# Patient Record
Sex: Male | Born: 1965 | Race: Black or African American | Hispanic: No | Marital: Single | Smoking: Never smoker
Health system: Southern US, Community
[De-identification: ages and names within clinical notes are randomized; demographics above are authoritative.]

## PROBLEM LIST (undated history)

## (undated) DIAGNOSIS — R519 Headache, unspecified: Secondary | ICD-10-CM

## (undated) DIAGNOSIS — R51 Headache: Secondary | ICD-10-CM

---

## 2014-12-28 ENCOUNTER — Emergency Department (HOSPITAL_COMMUNITY): Payer: 59

## 2014-12-28 ENCOUNTER — Encounter (HOSPITAL_COMMUNITY): Payer: Self-pay | Admitting: *Deleted

## 2014-12-28 ENCOUNTER — Emergency Department (HOSPITAL_COMMUNITY)
Admission: EM | Admit: 2014-12-28 | Discharge: 2014-12-28 | Disposition: A | Discharge status: Home / Self Care | Payer: 59 | Attending: Emergency Medicine | Admitting: Emergency Medicine

## 2014-12-28 DIAGNOSIS — S62613A Displaced fracture of proximal phalanx of left middle finger, initial encounter for closed fracture: Secondary | ICD-10-CM | POA: Diagnosis not present

## 2014-12-28 DIAGNOSIS — S0990XA Unspecified injury of head, initial encounter: Secondary | ICD-10-CM | POA: Insufficient documentation

## 2014-12-28 DIAGNOSIS — Y9389 Activity, other specified: Secondary | ICD-10-CM | POA: Insufficient documentation

## 2014-12-28 DIAGNOSIS — S4992XA Unspecified injury of left shoulder and upper arm, initial encounter: Secondary | ICD-10-CM | POA: Insufficient documentation

## 2014-12-28 DIAGNOSIS — S62629A Displaced fracture of medial phalanx of unspecified finger, initial encounter for closed fracture: Secondary | ICD-10-CM

## 2014-12-28 DIAGNOSIS — Y998 Other external cause status: Secondary | ICD-10-CM | POA: Insufficient documentation

## 2014-12-28 DIAGNOSIS — S59912A Unspecified injury of left forearm, initial encounter: Secondary | ICD-10-CM | POA: Diagnosis not present

## 2014-12-28 DIAGNOSIS — Y92410 Unspecified street and highway as the place of occurrence of the external cause: Secondary | ICD-10-CM | POA: Insufficient documentation

## 2014-12-28 DIAGNOSIS — S6992XA Unspecified injury of left wrist, hand and finger(s), initial encounter: Secondary | ICD-10-CM | POA: Diagnosis present

## 2014-12-28 MED ORDER — HYDROCODONE-ACETAMINOPHEN 5-325 MG PO TABS: 1.0000 | ORAL_TABLET | Freq: Four times a day (QID) | ORAL | Status: DC | PRN
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
## 2014-12-28 NOTE — Discharge Instructions (Signed)
Finger Fracture (Phalangeal) A broken bone of the finger (phalangealfracture) is a common injury for athletes. A single injury (trauma) is likely to fracture multiple bones on the same or different fingers. SYMPTOMS   Severe pain, at the time of injury.  Pain, tenderness, swelling, and later bruising of the finger and then the hand.  Visible deformity, if the fracture is complete and the bone fragments separate enough to distort the normal shape.  Numbness or coldness from swelling in the finger, causing pressure on blood vessels or nerves (uncommon). CAUSES  Direct or indirect injury (trauma) to the finger.  RISK INCREASES WITH:   Contact sports (football, rugby) or other sports where injury to the hand is likely (soccer, baseball, basketball).  Sports that require hitting (boxing, martial arts).  History of bone or joint disease, such as osteoporosis, or previous bone restraint.  Poor hand strength and flexibility. PREVENTION   For contact sports, wear appropriate and properly fitted protective equipment for the hand.  Learn and use proper technique when hitting, punching, or landing after a fall.  If you had a previous finger injury or hand restraint, use tape or padding to protect the finger when playing sports where finger injury is likely. PROGNOSIS  With proper treatment and normal alignment of the bones, healing can usually be expected in 4 to 6 weeks. Sometimes, surgery is needed.  RELATED COMPLICATIONS   Fracture does not heal (nonunion).  Bone heals in wrong position (malunion).  Chronic pain, stiffness, or swelling of the hand.  Excessive bleeding, causing pressure on nerves and blood vessels.  Unstable or arthritic joint, following repeated injury or delayed treatment.  Hindrance of normal growth in children.  Infection in skin broken over the fracture (open fracture) or at the incision or pin sites from surgery.  Shortening of injured bones.  Bony bumps  or loss of shape of the fingers.  Arthritic or stiff finger joint, if the fracture reaches the joint. TREATMENT  If the bones are properly aligned, treatment involves ice and medicine to reduce pain and inflammation. Then, the finger is restrained for 4 or more weeks, to allow for healing. If the fracture is out of alignment (displaced), involves more than one bone, or involves a joint, surgery is usually advised. Surgery often involves placing removable pins, screws, and sometimes plates, to hold the bones in proper alignment. After restraint (with or without surgery), stretching and strengthening exercises are needed. Exercises may be completed at home or with a therapist. For certain sports, wearing a splint or having the finger taped during future activity is advised.  MEDICATION   If pain medicine is needed, nonsteroidal anti-inflammatory medicines (aspirin and ibuprofen), or other minor pain relievers (acetaminophen), are often advised.  Do not take pain medicine for 7 days before surgery.  Prescription pain relievers are usually prescribed only after surgery. Use only as directed and only as much as you need. COLD THERAPY   Cold treatment (icing) relieves pain and reduces inflammation. Cold treatment should be applied for 10 to 15 minutes every 2 to 3 hours, and immediately after activity that aggravates your symptoms. Use ice packs or an ice massage. SEEK MEDICAL CARE IF:   Pain, tenderness, or swelling gets worse, despite treatment.  You experience pain, numbness, or coldness in the hand.  Blue, gray, or dark color appears in the fingernails.  Any of the following occur after surgery: fever, increased pain, swelling, redness, drainage of fluids, or bleeding in the affected area.  New,   unexplained symptoms develop. (Drugs used in treatment may produce side effects.) Document Released: 03/22/2005 Document Revised: 06/14/2011 Document Reviewed: 07/04/2008 ExitCare Patient  Information 2015 [employer]. This information is not intended to replace advice given to you by your health care provider. Make sure you discuss any questions you have with your health care provider.  

## 2015-07-15 ENCOUNTER — Emergency Department (HOSPITAL_COMMUNITY)
Admission: EM | Admit: 2015-07-15 | Discharge: 2015-07-15 | Disposition: A | Discharge status: Home / Self Care | Payer: 59 | Attending: Emergency Medicine | Admitting: Emergency Medicine

## 2015-07-15 ENCOUNTER — Encounter (HOSPITAL_COMMUNITY): Payer: Self-pay | Admitting: *Deleted

## 2015-07-15 DIAGNOSIS — R51 Headache: Secondary | ICD-10-CM | POA: Insufficient documentation

## 2015-07-15 DIAGNOSIS — R112 Nausea with vomiting, unspecified: Secondary | ICD-10-CM | POA: Insufficient documentation

## 2015-07-15 DIAGNOSIS — Z87828 Personal history of other (healed) physical injury and trauma: Secondary | ICD-10-CM | POA: Diagnosis not present

## 2015-07-15 DIAGNOSIS — H53149 Visual discomfort, unspecified: Secondary | ICD-10-CM | POA: Insufficient documentation

## 2015-07-15 DIAGNOSIS — R519 Headache, unspecified: Secondary | ICD-10-CM

## 2015-07-15 HISTORY — DX: Headache, unspecified: R51.9

## 2015-07-15 HISTORY — DX: Headache: R51

## 2015-07-15 MED ORDER — ONDANSETRON 4 MG PO TBDP
4.0000 mg | ORAL_TABLET | Freq: Once | ORAL | Status: AC
  Administered 2015-07-15: 4 mg via ORAL

## 2015-07-15 MED ORDER — PROCHLORPERAZINE EDISYLATE 5 MG/ML IJ SOLN
5.0000 mg | Freq: Once | INTRAMUSCULAR | Status: AC
  Administered 2015-07-15: 5 mg via INTRAVENOUS
  Filled 2015-07-15: qty 2

## 2015-07-15 MED ORDER — KETOROLAC TROMETHAMINE 30 MG/ML IJ SOLN
30.0000 mg | Freq: Once | INTRAMUSCULAR | Status: AC
  Administered 2015-07-15: 30 mg via INTRAVENOUS
  Filled 2015-07-15: qty 1

## 2015-07-15 MED ORDER — DIPHENHYDRAMINE HCL 50 MG/ML IJ SOLN
25.0000 mg | Freq: Once | INTRAMUSCULAR | Status: AC
  Administered 2015-07-15: 25 mg via INTRAVENOUS
  Filled 2015-07-15: qty 1

## 2015-07-15 MED ORDER — ONDANSETRON 4 MG PO TBDP
ORAL_TABLET | ORAL | Status: AC
  Filled 2015-07-15: qty 1

## 2015-07-15 NOTE — Discharge Instructions (Signed)
Please follow-up with the primary care doctor listed for further evaluation and treatment of your headaches and to establish care. Please return to emergency department if you develop any new or concerning symptoms.    Migraine Headache A migraine headache is an intense, throbbing pain on one or both sides of your head. A migraine can last for 30 minutes to several hours. CAUSES  The exact cause of a migraine headache is not always known. However, a migraine may be caused when nerves in the brain become irritated and release chemicals that cause inflammation. This causes pain. Certain things may also trigger migraines, such as:  Alcohol.  Smoking.  Stress.  Menstruation.  Aged cheeses.  Foods or drinks that contain nitrates, glutamate, aspartame, or tyramine.  Lack of sleep.  Chocolate.  Caffeine.  Hunger.  Physical exertion.  Fatigue.  Medicines used to treat chest pain (nitroglycerine), birth control pills, estrogen, and some blood pressure medicines. SIGNS AND SYMPTOMS  Pain on one or both sides of your head.  Pulsating or throbbing pain.  Severe pain that prevents daily activities.  Pain that is aggravated by any physical activity.  Nausea, vomiting, or both.  Dizziness.  Pain with exposure to bright lights, loud noises, or activity.  General sensitivity to bright lights, loud noises, or smells. Before you get a migraine, you may get warning signs that a migraine is coming (aura). An aura may include:  Seeing flashing lights.  Seeing bright spots, halos, or zigzag lines.  Having tunnel vision or blurred vision.  Having feelings of numbness or tingling.  Having trouble talking.  Having muscle weakness. DIAGNOSIS  A migraine headache is often diagnosed based on:  Symptoms.  Physical exam.  A CT scan or MRI of your head. These imaging tests cannot diagnose migraines, but they can help rule out other causes of headaches. TREATMENT Medicines  may be given for pain and nausea. Medicines can also be given to help prevent recurrent migraines.  HOME CARE INSTRUCTIONS  Only take over-the-counter or prescription medicines for pain or discomfort as directed by your health care provider. The use of long-term narcotics is not recommended.  Lie down in a dark, quiet room when you have a migraine.  Keep a journal to find out what may trigger your migraine headaches. For example, write down:  What you eat and drink.  How much sleep you get.  Any change to your diet or medicines.  Limit alcohol consumption.  Quit smoking if you smoke.  Get 7-9 hours of sleep, or as recommended by your health care provider.  Limit stress.  Keep lights dim if bright lights bother you and make your migraines worse. SEEK IMMEDIATE MEDICAL CARE IF:   Your migraine becomes severe.  You have a fever.  You have a stiff neck.  You have vision loss.  You have muscular weakness or loss of muscle control.  You start losing your balance or have trouble walking.  You feel faint or pass out.  You have severe symptoms that are different from your first symptoms. MAKE SURE YOU:   Understand these instructions.  Will watch your condition.  Will get help right away if you are not doing well or get worse.   This information is not intended to replace advice given to you by your health care provider. Make sure you discuss any questions you have with your health care provider.   Document Released: 03/22/2005 Document Revised: 04/12/2014 Document Reviewed: 11/27/2012 Elsevier Interactive Patient Education 2016 Elsevier  Inc. ° °
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
## 2015-07-15 NOTE — ED Notes (Addendum)
Pt c/o HA onset x 3 days ago with hx of frequent headaches without relief OTC meds, pt c/o sensitivity to light with nausea & x 1 vomiting episode, pt A&O x4, follows commands, speaks in complete sentences

## 2016-09-24 ENCOUNTER — Encounter (INDEPENDENT_AMBULATORY_CARE_PROVIDER_SITE_OTHER): Payer: Self-pay | Admitting: Orthopaedic Surgery

## 2016-09-24 ENCOUNTER — Ambulatory Visit (INDEPENDENT_AMBULATORY_CARE_PROVIDER_SITE_OTHER): Payer: 59 | Admitting: Orthopaedic Surgery

## 2016-09-24 DIAGNOSIS — S4491XA Injury of unspecified nerve at shoulder and upper arm level, right arm, initial encounter: Secondary | ICD-10-CM

## 2016-09-24 DIAGNOSIS — S143XXS Injury of brachial plexus, sequela: Secondary | ICD-10-CM | POA: Insufficient documentation

## 2016-09-24 NOTE — Progress Notes (Signed)
   Office Visit Note   Patient: Mark Dalton           Date of Birth: 06/06/1965           MRN: [id number] Visit Date: 09/24/2016              Requested by: No referring provider defined for this encounter. PCP: Patient, No Pcp Per   Assessment & Plan: Visit Diagnoses:  1. 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  F/u prn.  Follow-Up Instructions: Return if symptoms worsen or fail to improve.   Orders:  No orders of the defined types were placed in this encounter.  No orders of the defined types were placed in this encounter.     Procedures: No procedures performed   Clinical Data: No additional findings.   Subjective: Chief Complaint  Patient presents with  . Right arm    needs waiver signed for his CDL    Mark Dalton is a 51 yo male who had a right brachial plexus injury remotely comes in to the office to be evaluated for medical clearance to undergo his [place] CDL license test.  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  His RUE deformity has been stable.     Review of Systems  Constitutional: Negative.   All other systems reviewed and are negative.    Objective: Vital Signs: There were no vitals taken for this visit.  Physical Exam  Constitutional: He is oriented to person, place, and time. He appears well-developed and well-nourished.  HENT:  Head: Normocephalic and atraumatic.  Eyes: Pupils are equal, round, and reactive to light.  Neck: Neck supple.  Pulmonary/Chest: Effort normal.  Abdominal: Soft.  Musculoskeletal: Normal range of motion.  Neurological: He is alert and oriented to person, place, and time.  Skin: Skin is warm.  Psychiatric: He has a normal mood and affect. His behavior is normal. Judgment and thought content  normal.  Nursing note and vitals reviewed.   Ortho Exam RUE exam shows preserved C5 and C6 function.  He lacks C7-T1 function.  Flexible wrist drop. Specialty Comments:  No specialty comments available.  Imaging: No results found.   PMFS History: Patient Active Problem List   Diagnosis Date Noted  . 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 Frequent headaches     No family history on file.  No past surgical history on file. Social History   Occupational History  . Not on file.   Social History Main Topics  . Smoking status: Never Smoker  . Smokeless tobacco: Not on file  . Alcohol use No  . Drug use: No  . Sexual activity: Not on file

## 2016-09-27 ENCOUNTER — Telehealth (INDEPENDENT_AMBULATORY_CARE_PROVIDER_SITE_OTHER): Payer: Self-pay | Admitting: *Deleted

## 2016-09-27 NOTE — Telephone Encounter (Signed)
Pt came in to pick up form that was dropped off Friday 6/22. Asking for a call when this is complete

## 2016-09-28 NOTE — Telephone Encounter (Signed)
I looked at everything and the blank parts are not for me to fill out.

## 2016-09-28 NOTE — Telephone Encounter (Signed)
IC LM advising could be picked up at front desk.

## 2016-09-28 NOTE — Telephone Encounter (Signed)
I gave you this form yesterday.

## 2016-10-04 NOTE — Telephone Encounter (Signed)
Pt came in today to pick up paper that was left, for additional signatures? Pt stated he will come back tomorrow. Looked in drawer and didn't see anything left.

## 2016-10-05 NOTE — Telephone Encounter (Signed)
IC to discuss with patient. No answer. I distinctly remember putting form up front for patient to pick up. It even was logged same day on 06/26.  I have not seen it since then, but I too  Looked in drawer and was unable to locate it.
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
## 2018-03-14 ENCOUNTER — Ambulatory Visit (INDEPENDENT_AMBULATORY_CARE_PROVIDER_SITE_OTHER): Payer: 59 | Admitting: Orthopaedic Surgery

## 2018-03-14 ENCOUNTER — Encounter (INDEPENDENT_AMBULATORY_CARE_PROVIDER_SITE_OTHER): Payer: Self-pay | Admitting: Orthopaedic Surgery

## 2018-03-14 DIAGNOSIS — S4491XD Injury of unspecified nerve at shoulder and upper arm level, right arm, subsequent encounter: Secondary | ICD-10-CM

## 2018-03-14 DIAGNOSIS — S143XXS Injury of brachial plexus, sequela: Secondary | ICD-10-CM

## 2018-03-14 NOTE — Progress Notes (Signed)
   Office Visit Note   Patient: Mark Dalton           Date of Birth: 11/27/1965           MRN: [id number] Visit Date: 03/14/2018              Requested by: No referring provider defined for this encounter. PCP: Patient, No Pcp Per   Assessment & Plan: Visit Diagnoses:  1. 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  He is doing well and stable from orthopedic standpoint.  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  Follow-up as needed. Total face to face encounter time was greater than 25 minutes and over half of this time was spent in counseling and/or coordination of care.  Follow-Up Instructions: Return if symptoms worsen or fail to improve.   Orders:  No orders of the defined types were placed in this encounter.  No orders of the defined types were placed in this encounter.     Procedures: No procedures performed   Clinical Data: No additional findings.   Subjective: Chief Complaint  Patient presents with  . Right Hand - Follow-up    Mr. 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   Review of Systems   Objective: Vital Signs: There were no vitals taken for this visit.  Physical Exam  Ortho Exam Right upper extremity exam is consistent with chronic effects of right brachial plexus injury.  He has a well fitting wrist brace. Specialty Comments:  No specialty comments available.  Imaging: No results found.   PMFS History: Patient Active Problem List   Diagnosis Date Noted  . 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 Frequent headaches     History reviewed. No pertinent family history.  History reviewed. No pertinent surgical history. Social History   Occupational  History  . Not on file  Tobacco Use  . Smoking status: Never Smoker  Substance and Sexual Activity  . Alcohol use: No  . Drug use: No  . Sexual activity: Not on file

## 2018-09-20 ENCOUNTER — Other Ambulatory Visit: Payer: Self-pay

## 2018-09-20 ENCOUNTER — Encounter: Payer: Self-pay | Admitting: Orthopaedic Surgery

## 2018-09-20 ENCOUNTER — Ambulatory Visit (INDEPENDENT_AMBULATORY_CARE_PROVIDER_SITE_OTHER): Payer: 59 | Admitting: Orthopaedic Surgery

## 2018-09-20 VITALS — Ht 70.0 in | Wt 200.0 lb

## 2018-09-20 DIAGNOSIS — S143XXS Injury of brachial plexus, sequela: Secondary | ICD-10-CM | POA: Diagnosis not present

## 2018-09-20 DIAGNOSIS — R29898 Other symptoms and signs involving the musculoskeletal system: Secondary | ICD-10-CM | POA: Diagnosis not present

## 2018-09-20 NOTE — Progress Notes (Signed)
   Office Visit Note   Patient: Mark Dalton           Date of Birth: Apr 05, 1966           MRN: [id number] Visit Date: 09/20/2018              Requested by: No referring provider defined for this encounter. PCP: Patient, No Pcp Per   Assessment & Plan: Visit Diagnoses:  1. 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  He needs to wear his removable wrist splint on the right wrist while driving.  He will follow-up with [place] as needed.  Follow-Up Instructions: Return if symptoms worsen or fail to improve.   Orders:  No orders of the defined types were placed in this encounter.  No orders of the defined types were placed in this encounter.     Procedures: No procedures performed   Clinical Data: No additional findings.   Subjective: Chief Complaint  Patient presents with  . Right Hand - Follow-up    HPI patient is a pleasant 53 year old gentleman who presents our clinic today for correction of paperwork filled out several months ago from a remote brachial plexus injury to the right upper extremity.  He is currently a class a truck driver.  He was driving with a right wrist splint prior to the [place] pandemic without any issues.  During the pandemic, he had to take time off and it was noted that there was a discrepancy in his paperwork.  He comes in today to have this reviewed.  Review of Systems as detailed in HPI.  All others reviewed and are negative.   Objective: Vital Signs: Ht 5\' 10"  (1.778 m)   Wt 200 lb (90.7 kg)   BMI 28.70 kg/m   Physical Exam well-developed and well-nourished gentleman in no acute distress.  Alert and oriented x3.  Ortho Exam stable exam of the right upper extremity  Specialty Comments:  No specialty comments available.  Imaging: No new imaging   PMFS History: Patient Active Problem List   Diagnosis Date Noted  . Late effect of  injury of right brachial plexus 09/24/2016   Past Medical History:  Diagnosis Date  . Frequent headaches     History reviewed. No pertinent family history.  History reviewed. No pertinent surgical history. Social History   Occupational History  . Not on file  Tobacco Use  . Smoking status: Never Smoker  Substance and Sexual Activity  . Alcohol use: No  . Drug use: No  . Sexual activity: Not on file

## 2019-09-27 ENCOUNTER — Other Ambulatory Visit: Payer: Self-pay

## 2019-09-27 ENCOUNTER — Encounter (HOSPITAL_COMMUNITY): Payer: Self-pay

## 2019-09-27 ENCOUNTER — Emergency Department (HOSPITAL_COMMUNITY)
Admission: EM | Admit: 2019-09-27 | Discharge: 2019-09-27 | Disposition: A | Discharge status: Home / Self Care | Payer: 59 | Attending: Emergency Medicine | Admitting: Emergency Medicine

## 2019-09-27 ENCOUNTER — Emergency Department (HOSPITAL_COMMUNITY): Payer: 59

## 2019-09-27 DIAGNOSIS — Y9389 Activity, other specified: Secondary | ICD-10-CM | POA: Insufficient documentation

## 2019-09-27 DIAGNOSIS — Y999 Unspecified external cause status: Secondary | ICD-10-CM | POA: Insufficient documentation

## 2019-09-27 DIAGNOSIS — Z23 Encounter for immunization: Secondary | ICD-10-CM | POA: Diagnosis not present

## 2019-09-27 DIAGNOSIS — S62630A Displaced fracture of distal phalanx of right index finger, initial encounter for closed fracture: Secondary | ICD-10-CM

## 2019-09-27 DIAGNOSIS — W230XXA Caught, crushed, jammed, or pinched between moving objects, initial encounter: Secondary | ICD-10-CM | POA: Insufficient documentation

## 2019-09-27 DIAGNOSIS — Y92810 Car as the place of occurrence of the external cause: Secondary | ICD-10-CM | POA: Diagnosis not present

## 2019-09-27 DIAGNOSIS — S6991XA Unspecified injury of right wrist, hand and finger(s), initial encounter: Secondary | ICD-10-CM | POA: Diagnosis present

## 2019-09-27 DIAGNOSIS — Z79899 Other long term (current) drug therapy: Secondary | ICD-10-CM | POA: Diagnosis not present

## 2019-09-27 MED ORDER — CEPHALEXIN 500 MG PO CAPS: 500.0000 mg | ORAL_CAPSULE | Freq: Four times a day (QID) | ORAL | 0 refills | Status: AC

## 2019-09-27 MED ORDER — TETANUS-DIPHTH-ACELL PERTUSSIS 5-2.5-18.5 LF-MCG/0.5 IM SUSP
0.5000 mL | Freq: Once | INTRAMUSCULAR | Status: AC
Start: 2019-09-27 — End: 2019-09-27
  Administered 2019-09-27: 0.5 mL via INTRAMUSCULAR
  Filled 2019-09-27: qty 0.5

## 2019-09-27 MED ORDER — LIDOCAINE HCL (PF) 1 % IJ SOLN
5.0000 mL | Freq: Once | INTRAMUSCULAR | Status: AC
  Administered 2019-09-27: 5 mL
  Filled 2019-09-27: qty 5

## 2019-09-27 NOTE — ED Triage Notes (Signed)
Patient here with right index finger injury after shutting same in car door this am. Minimal bleeding around nailbed, dressing applied at triage. Minimal pain

## 2019-09-27 NOTE — Discharge Instructions (Addendum)
You have been seen here for a finger fracture.  You have been placed in a brace please keep finger in it and immobilize it for the next few days.  You may apply ice to the area as this will help with inflammation and swelling please keep it elevated.  Do not apply ice to bare skin as this can cause a burn.  You may alternate between taking ibuprofen and Tylenol every 6 hours for pain.  For example you may take ibuprofen wait 6 hours then take Tylenol waiting  6 hours then repeat.  Please follow dosing on the back of the bottle.  Of also prescribed antibiotics please take as prescribed.  Have given you the number for hand surgery please call them to schedule an appointment for reevaluation and management of your broken finger.  I have also given you information for community health and wellness they can provide you with a primary care provider.  I want you to come back to the emergency department if you develop fever, chills, increased swelling or redness around your finger, decreased sensation, or you experience severe pain [place] symptoms require further evaluation and management.

## 2019-09-27 NOTE — ED Provider Notes (Signed)
[facility] EMERGENCY DEPARTMENT Provider Note   CSN: [id number] Arrival date & time: 09/27/19  1761     History No chief complaint on file.   Mark Dalton is a 54 y.o. male.  HPI   Patient presents to the emergency department with chief complaint of right index finger pain that started today.  Patient states while he was getting into his car he slammed the car door against his finger.  Patient states he is unable to move his finger which is baseline for him as he had a stroke and is unable to move the right side of his body.  Patient denies any sort of alleviating or aggravating factors, he has not taken any medication for the pain.  Patient denies hitting his head, losing consciousness, and remembers all events leading up to the accident.  Patient does not have significant medical history, does not take medication on daily basis, denies being any blood thinners.  Patient denies headache, fever, chills, shortness of breath, chest pain, abdominal pain, nausea, vomiting, leg swelling, denies alcohol use, smoking tobacco or marijuana, denies drug use.  Past Medical History:  Diagnosis Date  . Frequent headaches     Patient Active Problem List   Diagnosis Date Noted  . 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 No pertinent surgical history.     No family history on file.  Social History   Tobacco Use  . Smoking status: Never Smoker  Substance Use Topics  . Alcohol use: No  . Drug use: No    Home Medications Prior to Admission medications   Medication Sig Start Date End Date Taking? Authorizing Provider  cephALEXin (KEFLEX) 500 MG capsule Take 1 capsule (500 mg total) by mouth 4 (four) times daily for 7 days. 09/27/19 10/04/19  Carroll Sage, PA-C  HYDROcodone-acetaminophen (NORCO/VICODIN) 5-325 MG per tablet Take 1 tablet by mouth every 6 (six) hours as needed. Patient not taking: Reported on 09/20/2018 12/28/14    Roxy Horseman, PA-C  naproxen sodium (ANAPROX) 220 MG tablet Take 220 mg by mouth daily as needed.     [provider]    Allergies    Patient has no known allergies.  Review of Systems   Review of Systems  Constitutional: Negative for chills and fever.  HENT: Negative for congestion, sneezing and sore throat.   Eyes: Negative for pain.  Respiratory: Negative for cough and shortness of breath.   Cardiovascular: Negative for chest pain and leg swelling.  Gastrointestinal: Negative for abdominal pain, diarrhea, nausea and vomiting.  Genitourinary: Negative for enuresis, flank pain and frequency.  Musculoskeletal: Negative for back pain and joint swelling.       Admits to right index finger pain  Skin: Negative for rash.  Neurological: Negative for dizziness and headaches.  Hematological: Does not bruise/bleed easily.    Physical Exam Updated Vital Signs BP (!) 144/60 (BP Location: Right Arm)   Pulse 65   Temp 97.9 F (36.6 C) (Oral)   Resp 18   Ht 5\' 10"  (1.778 m)   Wt 90.7 kg   SpO2 100%   BMI 28.70 kg/m   Physical Exam Vitals and nursing note reviewed.  Constitutional:      General: He is not in acute distress.    Appearance: Normal appearance. He is not ill-appearing or diaphoretic.  HENT:     Head: Normocephalic and atraumatic.     Nose: No congestion or rhinorrhea.  Eyes:  General: No scleral icterus.       Right eye: No discharge.        Left eye: No discharge.     Conjunctiva/sclera: Conjunctivae normal.     Pupils: Pupils are equal, round, and reactive to light.  Pulmonary:     Effort: Pulmonary effort is normal. No respiratory distress.     Breath sounds: Normal breath sounds. No wheezing.  Musculoskeletal:     Cervical back: Neck supple.     Right lower leg: No edema.     Left lower leg: No edema.     Comments: Patient's right index finger was visualized, there was erythema around the distal end of his finger, the proximal end of his  fingernail was partly lifted out of the skin bleeding was controlled.  There was no rash, laceration, or other gross abnormalities noted.  Patient had good capillary refill, finger was soft to the touch, sensation was intact unable to evaluate motor function as hand is paralyzed at baseline.  Skin:    General: Skin is warm and dry.     Capillary Refill: Capillary refill takes less than 2 seconds.     Coloration: Skin is not jaundiced or pale.  Neurological:     Mental Status: He is alert and oriented to person, place, and time.  Psychiatric:        Mood and Affect: Mood normal.     ED Results / Procedures / Treatments   Labs (all labs ordered are listed, but only abnormal results are displayed) Labs Reviewed - No data to display  EKG None  Radiology DG Finger Index Right  Result Date: 09/27/2019 CLINICAL DATA:  Slammed in a door EXAM: RIGHT INDEX FINGER 2+V COMPARISON:  None. FINDINGS: There is a comminuted mildly displaced fracture seen through the distal second phalanx. No definite intra-articular extension. Overlying soft tissue swelling is seen. The nail bed appears to be intact. IMPRESSION: Comminuted mildly displaced distal phalanx fracture. No definite intra-articular extension. Electronically Signed   By: Jonna Clark M.D.   On: 09/27/2019 08:13    Procedures Wound repair  Date/Time: 09/27/2019 4:53 PM Performed by: Carroll Sage, PA-C Authorized by: Carroll Sage, PA-C  Consent: Verbal consent obtained. Risks and benefits: risks, benefits and alternatives were discussed Consent given by: patient Patient understanding: patient states understanding of the procedure being performed Patient consent: the patient's understanding of the procedure matches consent given Procedure consent: procedure consent matches procedure scheduled Relevant documents: relevant documents present and verified Test results: test results available and properly labeled Site marked: the  operative site was marked Imaging studies: imaging studies available Patient identity confirmed: verbally with patient Preparation: Patient was prepped and draped in the usual sterile fashion. Local anesthesia used: yes Anesthesia: digital block  Anesthesia: Local anesthesia used: yes Local Anesthetic: lidocaine 1% without epinephrine  Sedation: Patient sedated: no  Patient tolerance: patient tolerated the procedure well with no immediate complications Comments: Nerve block was used, patient's finger was placed in saline solution and the area was cleaned thoroughly.  Fingernail was manipulated and try to place back under skin.  Unable to fully have nailbed underneath skin.  Area was washed and placed in a sterile dressing.  Sensation and vascular was reassessed and was normal, good cap refill.     (including critical care time)  Medications Ordered in ED Medications  Tdap (BOOSTRIX) injection 0.5 mL (0.5 mLs Intramuscular Given 09/27/19 1308)  lidocaine (PF) (XYLOCAINE) 1 % injection 5 mL (5  mLs Infiltration Given by Other 09/27/19 1300)    ED Course  I have reviewed the triage vital signs and the nursing notes.  Pertinent labs & imaging results that were available during my care of the patient were reviewed by me and considered in my medical decision making (see chart for details).    MDM Rules/Calculators/A&P                          I have personally reviewed all imaging, labs and have interpreted them.  X-ray of right index finger was obtained and showed commuted mildly displaced distal pharynx fracture.  Due to nailbed involvement possible open fracture and will place on antibiotics for treatment.  Unable to place fingernail underneath skin, distal part of nail protruded above the skin.  Will wash skin thoroughly, wrapped in dressing.  Finger will be placed in a splint for finger immobilization.  Patient was given tetanus booster.  Unlikely patient suffering from overlying  cellulitis as there was no discharge, drainage, wounds or duration noted on exam.  Unlikely patient suffering from a compartment syndrome as finger was soft to the touch, good capillary refill, sensation was intact.  Risk and benefits of removing nail was discussed with attending and due to increased risk of infection out ways benefits of removing the nail bed. will defer nail removal and  place finger in splint and wrapped in bandage.   Patient appears to be resting comfortably, patient does not appear to be in any acute distress.  Vitals have remained stable and patient does not meet criteria to be admitted to the hospital.  Likely patient suffered a fracture from a closing door on finger.  Will be placed in a splint as well as placed on antibiotics for infection coverage.  Patient will be referred to hand for further evaluation.  Patient was discussed with attending who agrees with assessment and plan.  Patient was given at home instructions as well as strict return precautions.  Patient was explained the results and plan, patient states he understands agrees to plan. Final Clinical Impression(s) / ED Diagnoses Final diagnoses:  Displaced fracture of distal phalanx of right index finger, initial encounter for closed fracture    Rx / DC Orders ED Discharge Orders         Ordered    cephALEXin (KEFLEX) 500 MG capsule  4 times daily     Discontinue  Reprint     09/27/19 1420           Aron Baba 09/27/19 1703    Tegeler, Gwenyth Allegra, MD 09/27/19 2148

## 2019-10-17 ENCOUNTER — Encounter (INDEPENDENT_AMBULATORY_CARE_PROVIDER_SITE_OTHER): Payer: Self-pay | Admitting: Primary Care

## 2019-10-17 ENCOUNTER — Other Ambulatory Visit: Payer: Self-pay

## 2019-10-17 ENCOUNTER — Other Ambulatory Visit (INDEPENDENT_AMBULATORY_CARE_PROVIDER_SITE_OTHER): Payer: 59

## 2019-10-17 ENCOUNTER — Ambulatory Visit (INDEPENDENT_AMBULATORY_CARE_PROVIDER_SITE_OTHER): Payer: 59 | Admitting: Primary Care

## 2019-10-17 VITALS — BP 136/82 | HR 89 | Temp 98.5°F | Ht 70.0 in | Wt 204.8 lb

## 2019-10-17 DIAGNOSIS — S143XXS Injury of brachial plexus, sequela: Secondary | ICD-10-CM

## 2019-10-17 DIAGNOSIS — Z125 Encounter for screening for malignant neoplasm of prostate: Secondary | ICD-10-CM

## 2019-10-17 DIAGNOSIS — Z6829 Body mass index (BMI) 29.0-29.9, adult: Secondary | ICD-10-CM | POA: Diagnosis not present

## 2019-10-17 DIAGNOSIS — Z1211 Encounter for screening for malignant neoplasm of colon: Secondary | ICD-10-CM

## 2019-10-17 DIAGNOSIS — Z7689 Persons encountering health services in other specified circumstances: Secondary | ICD-10-CM | POA: Diagnosis not present

## 2019-10-17 DIAGNOSIS — R030 Elevated blood-pressure reading, without diagnosis of hypertension: Secondary | ICD-10-CM

## 2019-10-17 NOTE — Progress Notes (Signed)
New Patient Office Visit  Subjective:  Patient ID: Mark Dalton, male    DOB: 08-15-1965  Age: 54 y.o. MRN: [id number]  CC:  Chief Complaint  Patient presents with   New Patient (Initial Visit)    pt would like referral to a specific hand specialist     HPI Mr. Mark Dalton presents for establish of care. He broke 4 th finger tip in the car door. Seen in ED they referred him to the Hand specialist. He went but because he was not referred by a PCP all these expenses are out of pocket.  Past Medical History:  Diagnosis Date   Frequent headaches     History reviewed. No pertinent surgical history.  History reviewed. No pertinent family history.  Social History   Socioeconomic History   Marital status: Single    Spouse name: Not on file   Number of children: Not on file   Years of education: Not on file   Highest education level: Not on file  Occupational History   Not on file  Tobacco Use   Smoking status: Never Smoker   Smokeless tobacco: Never Used  Substance and Sexual Activity   Alcohol use: No   Drug use: No   Sexual activity: Not Currently  Other Topics Concern   Not on file  Social History Narrative   Not on file   Social Determinants of Health   Financial Resource Strain:    Difficulty of Paying Living Expenses:   Food Insecurity:    Worried About Programme researcher, broadcasting/film/video in the Last Year:    Barista in the Last Year:   Transportation Needs:    Freight forwarder (Medical):    Lack of Transportation (Non-Medical):   Physical Activity:    Days of Exercise per Week:    Minutes of Exercise per Session:   Stress:    Feeling of Stress :   Social Connections:    Frequency of Communication with Friends and Family:    Frequency of Social Gatherings with Friends and Family:    Attends Religious Services:    Active Member of Clubs or Organizations:    Attends Engineer, structural:    Marital Status:   Intimate  Partner Violence:    Fear of Current or Ex-Partner:    Emotionally Abused:    Physically Abused:    Sexually Abused:     ROS Review of Systems  Musculoskeletal:       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    Objective:   Today's Vitals: BP 136/82 (BP Location: Right Arm, Patient Position: Sitting, Cuff Size: Normal)    Pulse 89    Temp 98.5 F (36.9 C) (Oral)    Ht 5\' 10"  (1.778 m)    Wt 204 lb 12.8 oz (92.9 kg)    SpO2 98%    BMI 29.39 kg/m   Physical Exam General: Vital signs reviewed.  Patient is obese  well-developed and well-nourished male , in no acute distress and cooperative with exam.  Head: Normocephalic and atraumatic. Eyes: EOMI, conjunctivae normal, no scleral icterus.  Neck: Supple, trachea midline, normal ROM, no JVD, masses, thyromegaly, or carotid bruit present.  Cardiovascular: RRR, S1 normal, S2 normal, no murmurs, gallops, or rubs. Pulmonary/Chest: Clear to auscultation bilaterally, no wheezes, rales, or rhonchi. Abdominal: Soft, non-tender, non-distended, BS +, no masses, organomegaly, or guarding present.  Musculoskeletal: No joint deformities, erythema, or stiffness,  ROM full and nontender. Extremities: No lower extremity edema bilaterally,  pulses symmetric and intact bilaterally. No cyanosis or clubbing. Neurological: A&O x3, Strength is normal and symmetric bilaterally, cranial nerve II-XII are grossly intact, no focal motor deficit, sensory intact to light touch bilaterally.  Skin: Warm, dry and intact. No rashes or erythema. Psychiatric: Normal mood and affect. speech and behavior is normal. Cognition and memory are normal.  Assessment & Plan:  Keller was seen today for new patient (initial visit).  Diagnoses and all orders for this visit:  Encounter to establish care .Gwinda Passe, NP-C will be your  (PCP) she is mastered prepared . Able to diagnosed and treatment also  answer health concern  as well as continuing care of varied medical conditions, not limited by cause, organ system, or diagnosis.   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 DIET: Limit salt intake, read nutrition labels to check salt content, limit fried and high fatty foods , can foods- enjoys spam , potted meat  Avoid using multisymptom OTC cold preparations that generally contain sudafed which can rise BP. Consult with pharmacist on best cold relief products to use for persons with HTN EXERCISE Discussed incorporating exercise such as walking - 30 minutes most days of the week and can do in 10 minute intervals   -     Comprehensive metabolic panel; Future   Body mass index (BMI) 29.0-29.9, adult Discussed diet and exercise for person with BMI >25. Instructed: You must burn more calories than you eat. Losing 5 percent of your body weight should be considered a success. In the longer term, losing more than 15 percent of your body weight and staying at this weight is an extremely good result. However, keep in mind that even losing 5 percent of your body weight leads to important health benefits, so try not to get discouraged if you're not able to lose more than this. Will recheck weight in 3-6 months. -     Lipid panel; Future -     PSA; Future  Colon cancer screening Normal colon cancer screening.  CDC recommends colorectal screening from ages 76-75.  -     Ambulatory referral to Gastroenterology  Prostate cancer screening Prostrate Cancer Screening For men aged 52 to 109 years, the decision to undergo periodic prostate-specific antigen (PSA)-based screening for prostate cancer should be an individual one.  -     PSA;  Future    Outpatient Encounter Medications as of 10/17/2019  Medication Sig   [DISCONTINUED] HYDROcodone-acetaminophen (NORCO/VICODIN) 5-325 MG per tablet Take 1 tablet by mouth every 6 (six) hours as needed. (Patient not taking: Reported on 09/20/2018)   [DISCONTINUED] naproxen sodium (ANAPROX) 220 MG tablet Take 220 mg by mouth daily as needed.    No facility-administered encounter medications on file as of 10/17/2019.    Follow-up: Return in about 3 months (around 01/17/2020) for in person Bp.   Grayce Sessions, NP

## 2019-10-17 NOTE — Patient Instructions (Signed)

## 2019-10-18 ENCOUNTER — Other Ambulatory Visit (INDEPENDENT_AMBULATORY_CARE_PROVIDER_SITE_OTHER): Payer: 59

## 2019-10-18 DIAGNOSIS — Z6829 Body mass index (BMI) 29.0-29.9, adult: Secondary | ICD-10-CM

## 2019-10-18 DIAGNOSIS — Z7689 Persons encountering health services in other specified circumstances: Secondary | ICD-10-CM

## 2019-10-18 DIAGNOSIS — R030 Elevated blood-pressure reading, without diagnosis of hypertension: Secondary | ICD-10-CM

## 2019-10-18 DIAGNOSIS — Z125 Encounter for screening for malignant neoplasm of prostate: Secondary | ICD-10-CM

## 2019-10-19 LAB — COMPREHENSIVE METABOLIC PANEL
ALT: 27 IU/L (ref 0–44)
AST: 26 IU/L (ref 0–40)
Albumin/Globulin Ratio: 1.5 (ref 1.2–2.2)
Albumin: 4.4 g/dL (ref 3.8–4.9)
Alkaline Phosphatase: 81 IU/L (ref 48–121)
BUN/Creatinine Ratio: 10 (ref 9–20)
BUN: 11 mg/dL (ref 6–24)
Bilirubin Total: 0.3 mg/dL (ref 0.0–1.2)
CO2: 24 mmol/L (ref 20–29)
Calcium: 9.2 mg/dL (ref 8.7–10.2)
Chloride: 104 mmol/L (ref 96–106)
Creatinine, Ser: 1.05 mg/dL (ref 0.76–1.27)
GFR calc Af Amer: 91 mL/min/{1.73_m2} (ref >59)
GFR calc non Af Amer: 79 mL/min/{1.73_m2} (ref >59)
Globulin, Total: 3 g/dL (ref 1.5–4.5)
Glucose: 95 mg/dL (ref 65–99)
Potassium: 4.3 mmol/L (ref 3.5–5.2)
Sodium: 140 mmol/L (ref 134–144)
Total Protein: 7.4 g/dL (ref 6.0–8.5)

## 2019-10-19 LAB — CBC WITH DIFFERENTIAL/PLATELET
Basophils Absolute: 0.1 10*3/uL (ref 0.0–0.2)
Basos: 1 %
EOS (ABSOLUTE): 0.2 10*3/uL (ref 0.0–0.4)
Eos: 4 %
Hematocrit: 28.9 % — ABNORMAL LOW (ref 37.5–51.0)
Hemoglobin: 7.9 g/dL — ABNORMAL LOW (ref 13.0–17.7)
Immature Grans (Abs): 0 10*3/uL (ref 0.0–0.1)
Immature Granulocytes: 0 %
Lymphocytes Absolute: 1.5 10*3/uL (ref 0.7–3.1)
Lymphs: 27 %
MCH: 18.8 pg — ABNORMAL LOW (ref 26.6–33.0)
MCHC: 27.3 g/dL — ABNORMAL LOW (ref 31.5–35.7)
MCV: 69 fL — ABNORMAL LOW (ref 79–97)
Monocytes Absolute: 0.4 10*3/uL (ref 0.1–0.9)
Monocytes: 7 %
Neutrophils Absolute: 3.3 10*3/uL (ref 1.4–7.0)
Neutrophils: 61 %
Platelets: 378 10*3/uL (ref 150–450)
RBC: 4.2 x10E6/uL (ref 4.14–5.80)
RDW: 16.4 % — ABNORMAL HIGH (ref 11.6–15.4)
WBC: 5.4 10*3/uL (ref 3.4–10.8)

## 2019-10-19 LAB — LIPID PANEL
Chol/HDL Ratio: 5.1 ratio — ABNORMAL HIGH (ref 0.0–5.0)
Cholesterol, Total: 169 mg/dL (ref 100–199)
HDL: 33 mg/dL — ABNORMAL LOW (ref >39)
LDL Chol Calc (NIH): 111 mg/dL — ABNORMAL HIGH (ref 0–99)
Triglycerides: 136 mg/dL (ref 0–149)
VLDL Cholesterol Cal: 25 mg/dL (ref 5–40)

## 2019-10-19 LAB — PSA: Prostate Specific Ag, Serum: 0.4 ng/mL (ref 0.0–4.0)

## 2019-10-30 ENCOUNTER — Other Ambulatory Visit (INDEPENDENT_AMBULATORY_CARE_PROVIDER_SITE_OTHER): Payer: Self-pay | Admitting: Primary Care

## 2019-10-30 MED ORDER — IRON (FERROUS SULFATE) 325 (65 FE) MG PO TABS: 325.0000 mg | ORAL_TABLET | Freq: Two times a day (BID) | ORAL | 1 refills | Status: DC

## 2019-11-06 ENCOUNTER — Telehealth (INDEPENDENT_AMBULATORY_CARE_PROVIDER_SITE_OTHER): Payer: Self-pay

## 2019-11-06 NOTE — Telephone Encounter (Signed)
Patient verified date of birth. He is aware of normal labs and normal PSA. Maryjean Morn, CMA

## 2020-01-17 ENCOUNTER — Ambulatory Visit (INDEPENDENT_AMBULATORY_CARE_PROVIDER_SITE_OTHER): Payer: 59 | Admitting: Primary Care

## 2022-02-02 ENCOUNTER — Encounter: Payer: Self-pay | Admitting: Emergency Medicine

## 2022-02-02 ENCOUNTER — Ambulatory Visit (INDEPENDENT_AMBULATORY_CARE_PROVIDER_SITE_OTHER): Payer: 59 | Admitting: Emergency Medicine

## 2022-02-02 VITALS — BP 136/82 | HR 78 | Temp 98.1°F | Ht 70.0 in | Wt 199.2 lb

## 2022-02-02 DIAGNOSIS — Z1159 Encounter for screening for other viral diseases: Secondary | ICD-10-CM

## 2022-02-02 DIAGNOSIS — Z13228 Encounter for screening for other metabolic disorders: Secondary | ICD-10-CM | POA: Diagnosis not present

## 2022-02-02 DIAGNOSIS — Z7689 Persons encountering health services in other specified circumstances: Secondary | ICD-10-CM

## 2022-02-02 DIAGNOSIS — N529 Male erectile dysfunction, unspecified: Secondary | ICD-10-CM

## 2022-02-02 DIAGNOSIS — Z130 Encounter for screening for diseases of the blood and blood-forming organs and certain disorders involving the immune mechanism: Secondary | ICD-10-CM

## 2022-02-02 DIAGNOSIS — Z0000 Encounter for general adult medical examination without abnormal findings: Secondary | ICD-10-CM | POA: Diagnosis not present

## 2022-02-02 DIAGNOSIS — Z125 Encounter for screening for malignant neoplasm of prostate: Secondary | ICD-10-CM

## 2022-02-02 DIAGNOSIS — Z114 Encounter for screening for human immunodeficiency virus [HIV]: Secondary | ICD-10-CM | POA: Diagnosis not present

## 2022-02-02 DIAGNOSIS — Z1329 Encounter for screening for other suspected endocrine disorder: Secondary | ICD-10-CM

## 2022-02-02 DIAGNOSIS — Z13220 Encounter for screening for lipoid disorders: Secondary | ICD-10-CM

## 2022-02-02 DIAGNOSIS — Z1211 Encounter for screening for malignant neoplasm of colon: Secondary | ICD-10-CM

## 2022-02-02 LAB — CBC WITH DIFFERENTIAL/PLATELET
Basophils Absolute: 0.1 10*3/uL (ref 0.0–0.1)
Basophils Relative: 1.2 % (ref 0.0–3.0)
Eosinophils Absolute: 0.3 10*3/uL (ref 0.0–0.7)
Eosinophils Relative: 4.8 % (ref 0.0–5.0)
HCT: 27.9 % — ABNORMAL LOW (ref 39.0–52.0)
Hemoglobin: 8.1 g/dL — ABNORMAL LOW (ref 13.0–17.0)
Lymphocytes Relative: 23.2 % (ref 12.0–46.0)
Lymphs Abs: 1.3 10*3/uL (ref 0.7–4.0)
MCHC: 28.9 g/dL — ABNORMAL LOW (ref 30.0–36.0)
MCV: 62.5 fl — ABNORMAL LOW (ref 78.0–100.0)
Monocytes Absolute: 0.4 10*3/uL (ref 0.1–1.0)
Monocytes Relative: 6.4 % (ref 3.0–12.0)
Neutro Abs: 3.7 10*3/uL (ref 1.4–7.7)
Neutrophils Relative %: 64.4 % (ref 43.0–77.0)
Platelets: 264 10*3/uL (ref 150.0–400.0)
RBC: 4.46 Mil/uL (ref 4.22–5.81)
RDW: 19 % — ABNORMAL HIGH (ref 11.5–15.5)
WBC: 5.7 10*3/uL (ref 4.0–10.5)

## 2022-02-02 LAB — COMPREHENSIVE METABOLIC PANEL
ALT: 11 U/L (ref 0–53)
AST: 19 U/L (ref 0–37)
Albumin: 4.2 g/dL (ref 3.5–5.2)
Alkaline Phosphatase: 66 U/L (ref 39–117)
BUN: 11 mg/dL (ref 6–23)
CO2: 28 mEq/L (ref 19–32)
Calcium: 8.6 mg/dL (ref 8.4–10.5)
Chloride: 104 mEq/L (ref 96–112)
Creatinine, Ser: 0.83 mg/dL (ref 0.40–1.50)
GFR: 96.37 mL/min (ref >60.00)
Glucose, Bld: 107 mg/dL — ABNORMAL HIGH (ref 70–99)
Potassium: 4.1 mEq/L (ref 3.5–5.1)
Sodium: 138 mEq/L (ref 135–145)
Total Bilirubin: 0.4 mg/dL (ref 0.2–1.2)
Total Protein: 7.1 g/dL (ref 6.0–8.3)

## 2022-02-02 LAB — LIPID PANEL
Cholesterol: 136 mg/dL (ref 0–200)
HDL: 35.5 mg/dL — ABNORMAL LOW (ref >39.00)
LDL Cholesterol: 65 mg/dL (ref 0–99)
NonHDL: 100.7
Total CHOL/HDL Ratio: 4
Triglycerides: 178 mg/dL — ABNORMAL HIGH (ref 0.0–149.0)
VLDL: 35.6 mg/dL (ref 0.0–40.0)

## 2022-02-02 LAB — HEMOGLOBIN A1C: Hgb A1c MFr Bld: 5.8 % (ref 4.6–6.5)

## 2022-02-02 LAB — PSA: PSA: 0.25 ng/mL (ref 0.10–4.00)

## 2022-02-02 MED ORDER — SILDENAFIL CITRATE 100 MG PO TABS: 50.0000 mg | ORAL_TABLET | Freq: Every day | ORAL | 11 refills | Status: DC | PRN

## 2022-02-02 NOTE — Progress Notes (Signed)
Mark Dalton 56 y.o.   Chief Complaint  Patient presents with   New Patient (Initial Visit)    Patient experiencing some numbness in his legs     HISTORY OF PRESENT ILLNESS: This is a 56 y.o. male first visit to this office, here to establish care with me. Wants to get a physical. No chronic medical problems.  No chronic medications. No smoker.  No EtOH use. Occasional intermittent numbness in his legs. No other complaints or medical concerns today.  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 No pertinent surgical history.  Social History   Socioeconomic History   Marital status: Single    Spouse name: Not on file   Number of children: Not on file   Years of education: Not on file   Highest education level: Not on file  Occupational History   Not on file  Tobacco Use   Smoking status: Never   Smokeless tobacco: Never  Substance and Sexual Activity   Alcohol use: No   Drug use: No   Sexual activity: Not Currently  Other Topics Concern   Not on file  Social History Narrative   Not on file   Social Determinants of Health   Financial Resource Strain: Not on file  Food Insecurity: Not on file  Transportation Needs: Not on file  Physical Activity: Not on file  Stress: Not on file  Social Connections: Not on file  Intimate Partner Violence: Not on file    History reviewed. No pertinent family history.   Review of Systems  Constitutional: Negative.   HENT: Negative.  Negative for congestion and sore throat.   Eyes: Negative.   Respiratory: Negative.  Negative for cough and shortness of breath.   Cardiovascular: Negative.  Negative for chest pain and palpitations.  Gastrointestinal: Negative.  Negative for abdominal pain,  diarrhea, nausea and vomiting.  Genitourinary: Negative.  Negative for dysuria and hematuria.  Skin: Negative.   Neurological: Negative.  Negative for dizziness and headaches.  All other systems reviewed and are negative.  Today's Vitals   02/02/22 1524  BP: 136/82  Pulse: 78  Temp: 98.1 F (36.7 C)  TempSrc: Oral  SpO2: 96%  Weight: 199 lb 4 oz (90.4 kg)  Height: 5\' 10"  (1.778 m)   Body mass index is 28.59 kg/m.   Physical Exam Vitals reviewed.  Constitutional:      Appearance: Normal appearance.  HENT:     Head: Normocephalic.     Right Ear: Tympanic membrane, ear canal and external ear normal.     Left Ear: Tympanic membrane, ear canal and external ear normal.     Mouth/Throat:     Mouth: Mucous membranes are moist.     Pharynx: Oropharynx is clear.  Eyes:     Extraocular Movements: Extraocular movements intact.     Conjunctiva/sclera: Conjunctivae normal.     Pupils: Pupils are equal, round, and reactive to light.  Cardiovascular:     Rate and Rhythm: Normal rate and regular rhythm.     Pulses: Normal pulses.     Heart sounds: Normal heart sounds.  Pulmonary:     Effort: Pulmonary effort is normal.     Breath sounds: Normal  breath sounds.  Abdominal:     General: There is no distension.     Palpations: Abdomen is soft.     Tenderness: There is no abdominal tenderness.  Musculoskeletal:     Cervical back: Normal range of motion. No tenderness.     Comments: Right arm muscle atrophy with spasticity secondary to old injury to brachial plexus. Uses splint on right wrist.  Lymphadenopathy:     Cervical: No cervical adenopathy.  Skin:    General: Skin is warm and dry.     Capillary Refill: Capillary refill takes less than 2 seconds.  Neurological:     General: No focal deficit present.     Mental Status: He is alert and oriented to person, place, and time.  Psychiatric:        Mood and Affect: Mood normal.        Behavior: Behavior normal.       ASSESSMENT & PLAN: Problem List Items Addressed This Visit   None Visit Diagnoses     Routine general medical examination at a health care facility    -  Primary   Encounter to establish care       Prostate cancer screening       Relevant Orders   PSA(Must document that pt has been informed of limitations of PSA testing.)   Need for hepatitis C screening test       Relevant Orders   Hepatitis C antibody screen   Screening for HIV (human immunodeficiency virus)       Relevant Orders   HIV antibody   Colon cancer screening       Relevant Orders   Ambulatory referral to Gastroenterology   Screening for deficiency anemia       Relevant Orders   CBC with Differential   Screening for lipoid disorders       Relevant Orders   Lipid panel   Screening for endocrine, metabolic and immunity disorder       Relevant Orders   Comprehensive metabolic panel   Hemoglobin A1c      Modifiable risk factors discussed with patient. Anticipatory guidance according to age provided. The following topics were also discussed: Social Determinants of Health Smoking.  Non-smoker Diet and nutrition Benefits of exercise Cancer screening and need for colon cancer screening with colonoscopy Vaccinations reviewed and recommendations Cardiovascular risk assessment and need for blood work Mental health including depression and anxiety Fall and accident prevention  Patient Instructions  Health Maintenance, Male Adopting a healthy lifestyle and getting preventive care are important in promoting health and wellness. Ask your health care provider about: The right schedule for you to have regular tests and exams. Things you can do on your own to prevent diseases and keep yourself healthy. What should I know about diet, weight, and exercise? Eat a healthy diet  Eat a diet that includes plenty of vegetables, fruits, low-fat dairy products, and lean protein. Do not eat a lot of foods that are high  in solid fats, added sugars, or sodium. Maintain a healthy weight Body mass index (BMI) is a measurement that can be used to identify possible weight problems. It estimates body fat based on height and weight. Your health care provider can help determine your BMI and help you achieve or maintain a healthy weight. Get regular exercise Get regular exercise. This is one of the most important things you can do for your health. Most adults should: Exercise for at least 150 minutes each week. The  exercise should increase your heart rate and make you sweat (moderate-intensity exercise). Do strengthening exercises at least twice a week. This is in addition to the moderate-intensity exercise. Spend less time sitting. Even light physical activity can be beneficial. Watch cholesterol and blood lipids Have your blood tested for lipids and cholesterol at 58 years of age, then have this test every 5 years. You may need to have your cholesterol levels checked more often if: Your lipid or cholesterol levels are high. You are older than 58 years of age. You are at high risk for heart disease. What should I know about cancer screening? Many types of cancers can be detected early and may often be prevented. Depending on your health history and family history, you may need to have cancer screening at various ages. This may include screening for: Colorectal cancer. Prostate cancer. Skin cancer. Lung cancer. What should I know about heart disease, diabetes, and high blood pressure? Blood pressure and heart disease High blood pressure causes heart disease and increases the risk of stroke. This is more likely to develop in people who have high blood pressure readings or are overweight. Talk with your health care provider about your target blood pressure readings. Have your blood pressure checked: Every 3-5 years if you are 29-37 years of age. Every year if you are 36 years old or older. If you are between the  ages of 86 and 38 and are a current or former smoker, ask your health care provider if you should have a one-time screening for abdominal aortic aneurysm (AAA). Diabetes Have regular diabetes screenings. This checks your fasting blood sugar level. Have the screening done: Once every three years after age 42 if you are at a normal weight and have a low risk for diabetes. More often and at a younger age if you are overweight or have a high risk for diabetes. What should I know about preventing infection? Hepatitis B If you have a higher risk for hepatitis B, you should be screened for this virus. Talk with your health care provider to find out if you are at risk for hepatitis B infection. Hepatitis C Blood testing is recommended for: Everyone born from [birth year] through 1965. Anyone with known risk factors for hepatitis C. Sexually transmitted infections (STIs) You should be screened each year for STIs, including gonorrhea and chlamydia, if: You are sexually active and are younger than 58 years of age. You are older than 58 years of age and your health care provider tells you that you are at risk for this type of infection. Your sexual activity has changed since you were last screened, and you are at increased risk for chlamydia or gonorrhea. Ask your health care provider if you are at risk. Ask your health care provider about whether you are at high risk for HIV. Your health care provider may recommend a prescription medicine to help prevent HIV infection. If you choose to take medicine to prevent HIV, you should first get tested for HIV. You should then be tested every 3 months for as long as you are taking the medicine. Follow these instructions at home: Alcohol use Do not drink alcohol if your health care provider tells you not to drink. If you drink alcohol: Limit how much you have to 0-2 drinks a day. Know how much alcohol is in your drink. In the [place] one drink equals one 12 oz bottle of beer  (355 mL), one 5 oz glass of wine (148 mL), or one  1 oz glass of hard liquor (44 mL). Lifestyle Do not use any products that contain nicotine or tobacco. These products include cigarettes, chewing tobacco, and vaping devices, such as e-cigarettes. If you need help quitting, ask your health care provider. Do not use street drugs. Do not share needles. Ask your health care provider for help if you need support or information about quitting drugs. General instructions Schedule regular health, dental, and eye exams. Stay current with your vaccines. Tell your health care provider if: You often feel depressed. You have ever been abused or do not feel safe at home. Summary Adopting a healthy lifestyle and getting preventive care are important in promoting health and wellness. Follow your health care provider's instructions about healthy diet, exercising, and getting tested or screened for diseases. Follow your health care provider's instructions on monitoring your cholesterol and blood pressure. This information is not intended to replace advice given to you by your health care provider. Make sure you discuss any questions you have with your health care provider. Document Revised: 08/11/2020 Document Reviewed: 08/11/2020 Elsevier Patient Education  2023 Elsevier Inc.     Edwina Barth, MD [facility]

## 2022-02-02 NOTE — Addendum Note (Signed)
Addended by: Davina Poke on: 02/02/2022 03:46 PM   Modules accepted: Orders

## 2022-02-02 NOTE — Patient Instructions (Signed)
Health Maintenance, Male Adopting a healthy lifestyle and getting preventive care are important in promoting health and wellness. Ask your health care provider about: The right schedule for you to have regular tests and exams. Things you can do on your own to prevent diseases and keep yourself healthy. What should I know about diet, weight, and exercise? Eat a healthy diet  Eat a diet that includes plenty of vegetables, fruits, low-fat dairy products, and lean protein. Do not eat a lot of foods that are high in solid fats, added sugars, or sodium. Maintain a healthy weight Body mass index (BMI) is a measurement that can be used to identify possible weight problems. It estimates body fat based on height and weight. Your health care provider can help determine your BMI and help you achieve or maintain a healthy weight. Get regular exercise Get regular exercise. This is one of the most important things you can do for your health. Most adults should: Exercise for at least 150 minutes each week. The exercise should increase your heart rate and make you sweat (moderate-intensity exercise). Do strengthening exercises at least twice a week. This is in addition to the moderate-intensity exercise. Spend less time sitting. Even light physical activity can be beneficial. Watch cholesterol and blood lipids Have your blood tested for lipids and cholesterol at 58 years of age, then have this test every 5 years. You may need to have your cholesterol levels checked more often if: Your lipid or cholesterol levels are high. You are older than 58 years of age. You are at high risk for heart disease. What should I know about cancer screening? Many types of cancers can be detected early and may often be prevented. Depending on your health history and family history, you may need to have cancer screening at various ages. This may include screening for: Colorectal cancer. Prostate cancer. Skin cancer. Lung  cancer. What should I know about heart disease, diabetes, and high blood pressure? Blood pressure and heart disease High blood pressure causes heart disease and increases the risk of stroke. This is more likely to develop in people who have high blood pressure readings or are overweight. Talk with your health care provider about your target blood pressure readings. Have your blood pressure checked: Every 3-5 years if you are 18-39 years of age. Every year if you are 40 years old or older. If you are between the ages of 65 and 75 and are a current or former smoker, ask your health care provider if you should have a one-time screening for abdominal aortic aneurysm (AAA). Diabetes Have regular diabetes screenings. This checks your fasting blood sugar level. Have the screening done: Once every three years after age 45 if you are at a normal weight and have a low risk for diabetes. More often and at a younger age if you are overweight or have a high risk for diabetes. What should I know about preventing infection? Hepatitis B If you have a higher risk for hepatitis B, you should be screened for this virus. Talk with your health care provider to find out if you are at risk for hepatitis B infection. Hepatitis C Blood testing is recommended for: Everyone born from 1945 through 1965. Anyone with known risk factors for hepatitis C. Sexually transmitted infections (STIs) You should be screened each year for STIs, including gonorrhea and chlamydia, if: You are sexually active and are younger than 58 years of age. You are older than 58 years of age and your   health care provider tells you that you are at risk for this type of infection. Your sexual activity has changed since you were last screened, and you are at increased risk for chlamydia or gonorrhea. Ask your health care provider if you are at risk. Ask your health care provider about whether you are at high risk for HIV. Your health care provider  may recommend a prescription medicine to help prevent HIV infection. If you choose to take medicine to prevent HIV, you should first get tested for HIV. You should then be tested every 3 months for as long as you are taking the medicine. Follow these instructions at home: Alcohol use Do not drink alcohol if your health care provider tells you not to drink. If you drink alcohol: Limit how much you have to 0-2 drinks a day. Know how much alcohol is in your drink. In the [place] one drink equals one 12 oz bottle of beer (355 mL), one 5 oz glass of wine (148 mL), or one 1 oz glass of hard liquor (44 mL). Lifestyle Do not use any products that contain nicotine or tobacco. These products include cigarettes, chewing tobacco, and vaping devices, such as e-cigarettes. If you need help quitting, ask your health care provider. Do not use street drugs. Do not share needles. Ask your health care provider for help if you need support or information about quitting drugs. General instructions Schedule regular health, dental, and eye exams. Stay current with your vaccines. Tell your health care provider if: You often feel depressed. You have ever been abused or do not feel safe at home. Summary Adopting a healthy lifestyle and getting preventive care are important in promoting health and wellness. Follow your health care provider's instructions about healthy diet, exercising, and getting tested or screened for diseases. Follow your health care provider's instructions on monitoring your cholesterol and blood pressure. This information is not intended to replace advice given to you by your health care provider. Make sure you discuss any questions you have with your health care provider. Document Revised: 08/11/2020 Document Reviewed: 08/11/2020 Elsevier Patient Education  2023 Elsevier Inc.  

## 2022-02-03 ENCOUNTER — Other Ambulatory Visit: Payer: Self-pay | Admitting: Emergency Medicine

## 2022-02-03 ENCOUNTER — Other Ambulatory Visit: Payer: 59

## 2022-02-03 DIAGNOSIS — D649 Anemia, unspecified: Secondary | ICD-10-CM

## 2022-02-03 LAB — HIV ANTIBODY (ROUTINE TESTING W REFLEX): HIV 1&2 Ab, 4th Generation: NONREACTIVE

## 2022-02-03 LAB — HEPATITIS C ANTIBODY: Hepatitis C Ab: NONREACTIVE

## 2022-02-04 ENCOUNTER — Other Ambulatory Visit: Payer: Self-pay | Admitting: Emergency Medicine

## 2022-02-04 DIAGNOSIS — D509 Iron deficiency anemia, unspecified: Secondary | ICD-10-CM

## 2022-02-04 LAB — ANEMIA PANEL
Ferritin: 4 ng/mL — ABNORMAL LOW (ref 30–400)
Hematocrit: 30.5 % — ABNORMAL LOW (ref 37.5–51.0)
Iron Saturation: 4 % — CL (ref 15–55)
Iron: 16 ug/dL — ABNORMAL LOW (ref 38–169)
Retic Ct Pct: 1.4 % (ref 0.6–2.6)
Total Iron Binding Capacity: 446 ug/dL (ref 250–450)
UIBC: 430 ug/dL — ABNORMAL HIGH (ref 111–343)
Vitamin B-12: 404 pg/mL (ref 232–1245)

## 2022-02-04 LAB — SPECIMEN STATUS REPORT

## 2022-02-04 MED ORDER — ACCRUFER 30 MG PO CAPS: 30.0000 mg | ORAL_CAPSULE | Freq: Two times a day (BID) | ORAL | 1 refills | Status: AC

## 2022-12-08 ENCOUNTER — Other Ambulatory Visit (INDEPENDENT_AMBULATORY_CARE_PROVIDER_SITE_OTHER): Payer: 59

## 2022-12-08 ENCOUNTER — Ambulatory Visit (INDEPENDENT_AMBULATORY_CARE_PROVIDER_SITE_OTHER): Payer: 59 | Admitting: Orthopedic Surgery

## 2022-12-08 DIAGNOSIS — M25522 Pain in left elbow: Secondary | ICD-10-CM

## 2022-12-08 NOTE — Progress Notes (Signed)
Mark Dalton - 57 y.o. male MRN [id number]  Date of birth: [birth year]  Office Visit Note: Visit Date: 12/08/2022 PCP: Georgina Quint, MD Referred by: Georgina Quint, *  Subjective: No chief complaint on file.  HPI: Mark Dalton is a pleasant 57 y.o. male who presents today for evaluation of ongoing numbness and tingling of the left arm from the left elbow down to the hand.  He is complex history of a prior right upper extremity brachial plexus stretch injury in 1990 that has been treated conservatively.  He wears a wrist brace on the right side for ongoing wrist weakness.  As far as the left upper extremities concern, he states that the numbness and tingling has progressed over the past multiple months, he does have nocturnal symptoms as well.  He is concerned given that he is fully reliant on his left upper extremity currently.  Pertinent ROS were reviewed with the patient and found to be negative unless otherwise specified above in HPI.   Visit Reason: left elbow Hand dominance: left Occupation:  Diabetic: No Heart/Lung History: no Blood Thinners:   Prior Testing/EMG: none Injections (Date): none Treatments:none Prior Surgery: none  Left elbow pain/numbness in hand and weakness  Also right arm pain- history of injury in the '90s. No use of arm   Assessment & Plan: Visit Diagnoses:  1. Pain in left elbow     Plan: Extensive discussion was had the patient today regarding his left upper extremity complaints.  His clinical examination today of the left upper extremity is fairly benign, however given his ongoing symptoms of numbness and tingling which is progressing and associated nocturnal symptoms, I would like him to undergo left extremity EMG testing in order to determine potential nerve compression.  This is particular important given that he is fully reliant upon his left upper extremity for daily activities and function.  As for the right upper extremity, he  does have a known history of a prior brachial plexus injury with significant neuropraxia.  He feels that his strength and function in the right upper extremity may be improving and his function has changed somewhat, for this reason, we can also obtain an EMG of the right upper extremity to better delineate the status of his nerve function and motor endplates in the right upper extremity.  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  This would be helpful in order to better determine how well his recovery would go should we have to perform intervention on the left side for nerve compression.  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  He may be a candidate for potential  Follow-up: No follow-ups on file.   Meds & Orders: No orders of the defined types were placed in this encounter.   Orders Placed This Encounter  Procedures   XR Elbow 2 Views Left   Ambulatory referral to Physical Medicine Rehab     Procedures: No procedures performed      Clinical History: No specialty comments available.  He reports that he has never smoked. He has never used smokeless tobacco.  Recent Labs    02/02/22 1548  HGBA1C 5.8    Objective:   Vital Signs: There were no vitals taken for this visit.  Physical Exam  Gen: Well-appearing, in no acute distress; non-toxic CV: Regular Rate.  Well-perfused. Warm.  Resp: Breathing unlabored on room air; no wheezing. Psych: Fluid speech in conversation; appropriate affect; normal thought process  Ortho Exam Right upper extremity: - Significant atrophy noted to the right arm, distal to the elbow region - Able to perform elbow flexion/extension - Wrist currently in brace held in contracted/supinated position - Does have intact sensation median/radial/ulnar sensory of the  hand   Left upper extremity: - Full elbow range of motion without restriction, mildly positive Tinel's at the cubital tunnel region, no significant scratch collapse of the left upper extremity - Negative Tinel's of the carpal tunnel, negative Phalen's, negative Durkan's compression - Intact sensation all distributions to light touch median/radial/ulnar - Hand is warm well-perfused - 5/5 APB without significant atrophy, negative Froment's  Imaging: XR Elbow 2 Views Left  Result Date: 12/08/2022 X-rays of the left elbow, 2 views were obtained today X-rays demonstrate well located radiocapitellar, ulnohumeral and proximal radioulnar articulations without significant bony abnormalities.   Past Medical/Family/Surgical/Social History: Medications & Allergies reviewed per EMR, new medications updated. 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 No past surgical history on file. Social History   Occupational History   Not on file  Tobacco Use   Smoking status: Never   Smokeless tobacco: Never  Substance and Sexual Activity   Alcohol use: No   Drug use: No   Sexual activity: Not Currently    Creedon Danielski Fara Boros) Denese Killings, M.D. [facility] 11:41 AM

## 2022-12-17 ENCOUNTER — Ambulatory Visit (INDEPENDENT_AMBULATORY_CARE_PROVIDER_SITE_OTHER): Payer: 59 | Admitting: Physical Medicine and Rehabilitation

## 2022-12-17 DIAGNOSIS — M79642 Pain in left hand: Secondary | ICD-10-CM

## 2022-12-17 DIAGNOSIS — S143XXS Injury of brachial plexus, sequela: Secondary | ICD-10-CM

## 2022-12-17 DIAGNOSIS — R202 Paresthesia of skin: Secondary | ICD-10-CM | POA: Diagnosis not present

## 2022-12-17 NOTE — Progress Notes (Unsigned)
Functional Pain Scale - descriptive words and definitions  Uncomfortable (3)  Pain is present but can complete all ADL's/sleep is slightly affected and passive distraction only gives marginal relief. Mild range order  Average Pain  varies  Left handed. Pain in left arm from elbow to wrist

## 2022-12-21 NOTE — Progress Notes (Signed)
Mark Dalton - 57 y.o. male MRN [id number]  Date of birth: 12-13-1965  Office Visit Note: Visit Date: 12/17/2022 PCP: Georgina Quint, MD Referred by: [name], MD  Subjective: Chief Complaint  Patient presents with   Left Arm - Pain, Numbness, Weakness   HPI: Mark Dalton is a 57 y.o. male who comes in today at the request of Dr. Bonner Puna for evaluation and management of chronic, worsening and severe pain, numbness and tingling in the Left upper extremities.  Patient is Left hand dominant.  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  Now having left-sided hand complaints with numbness tingling and weakness and is very concerning since he uses his left hand now essentially for all of his actives of daily living.  He reports somewhat of a global numbness in the left hand that goes from the elbow to the wrist at times not necessarily up from the hand.  No frank radicular symptoms on the left.  He does not remember having had electrodiagnostic studies in the past and I do not see need for review.  He does get nocturnal complaints with positive flick sign.      ROS Otherwise per HPI.  Assessment & Plan: Visit Diagnoses:    ICD-10-CM   1. Paresthesia of skin  R20.2 NCV with EMG (electromyography)    2. Pain in left hand  M79.642     3. 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    There is no significant electrodiagnostic evidence of any other focal nerve entrapment, brachial plexopathy or cervical radiculopathy.    Recommendations: 1.  Follow-up with referring physician. 2.  Continue current management of symptoms. 3.  Suggest surgical evaluation.    Meds & Orders: No orders  of the defined types were placed in this encounter.   Orders Placed This Encounter  Procedures   NCV with EMG (electromyography)    Follow-up: Return for [name], MD.   Procedures: No procedures performed  EMG & NCV Findings: Evaluation of the left median motor nerve showed prolonged distal onset latency (6.2 ms), reduced amplitude (2.9 mV), and decreased conduction velocity (Elbow-Wrist, 48 m/s).  The left median (across palm) sensory nerve showed no response (Palm) and prolonged distal peak latency (5.2 ms).  All remaining nerves (as indicated in the following tables) were within normal limits.    All examined muscles (as indicated in the following table) showed no evidence of electrical instability.    Impression: The above electrodiagnostic study is ABNORMAL and reveals evidence of a severe left median nerve entrapment at the wrist (carpal tunnel syndrome) affecting sensory and motor components.   There is no significant electrodiagnostic evidence of any other focal nerve entrapment, brachial plexopathy or cervical radiculopathy.   Recommendations: 1.  Follow-up with referring physician. 2.  Continue current management of symptoms. 3.  Suggest surgical evaluation.  ___________________________ [name] FAAPMR Board Certified, American Board of Physical Medicine and Rehabilitation    Nerve Conduction Studies Anti Sensory Summary Table   Stim Site NR Peak (ms) Norm Peak (ms) P-T Amp (V) Norm P-T Amp Site1 Site2 Delta-P (ms) Dist (cm) Vel (m/s) Norm Vel (m/s)  Left Median Acr Palm Anti Sensory (2nd Digit)  31C  Wrist    *5.2 <3.6 14.0 >10 Wrist Palm  0.0    Palm *NR  <2.0          Left Radial Anti Sensory (Base 1st Digit)  31.5C  Wrist    2.3 <3.1 11.7  Wrist Base 1st Digit 2.3 0.0    Left Ulnar Anti Sensory (5th Digit)  31.6C  Wrist    3.4 <3.7 30.1 >15.0 Wrist 5th Digit 3.4 14.0 41 >38   Motor Summary Table   Stim Site NR Onset (ms) Norm Onset (ms) O-P  Amp (mV) Norm O-P Amp Site1 Site2 Delta-0 (ms) Dist (cm) Vel (m/s) Norm Vel (m/s)  Left Median Motor (Abd Poll Brev)  31.9C    Martin-Gruber  Wrist    *6.2 <4.2 *2.9 >5 Elbow Wrist 4.9 23.5 *48 >50  Elbow    11.1  2.8         Left Ulnar Motor (Abd Dig Min)  32.1C  Wrist    3.8 <4.2 8.5 >3 B Elbow Wrist 3.5 21.0 60 >53  B Elbow    7.3  8.1  A Elbow B Elbow 1.2 11.0 92 >53  A Elbow    8.5  8.0          EMG   Side Muscle Nerve Root Ins Act Fibs Psw Amp Dur Poly Recrt Int Dennie Bible Comment  Left 1stDorInt Ulnar C8-T1 Nml Nml Nml Nml Nml 0 Nml Nml   Left Abd Poll Brev Median C8-T1 Nml Nml Nml Nml Nml 0 Nml Nml   Left ExtDigCom   Nml Nml Nml Nml Nml 0 Nml Nml   Left Triceps Radial C6-7-8 Nml Nml Nml Nml Nml 0 Nml Nml   Left Deltoid Axillary C5-6 Nml Nml Nml Nml Nml 0 Nml Nml     Nerve Conduction Studies Anti Sensory Left/Right Comparison   Stim Site L Lat (ms) R Lat (ms) L-R Lat (ms) L Amp (V) R Amp (V) L-R Amp (%) Site1 Site2 L Vel (m/s) R Vel (m/s) L-R Vel (m/s)  Median Acr Palm Anti Sensory (2nd Digit)  31C  Wrist *5.2   14.0   Wrist Palm     Palm             Radial Anti Sensory (Base 1st Digit)  31.5C  Wrist 2.3   11.7   Wrist Base 1st Digit     Ulnar Anti Sensory (5th Digit)  31.6C  Wrist 3.4   30.1   Wrist 5th Digit 41     Motor Left/Right Comparison   Stim Site L Lat (ms) R Lat (ms) L-R Lat (ms) L Amp (mV) R Amp (mV) L-R Amp (%) Site1 Site2 L Vel (m/s) R Vel (m/s) L-R Vel (m/s)  Median Motor (Abd Poll Brev)  31.9C    Martin-Gruber  Wrist *6.2   *2.9   Elbow Wrist *48    Elbow 11.1   2.8         Ulnar Motor (Abd Dig Min)  32.1C  Wrist 3.8   8.5   B Elbow Wrist 60    B Elbow 7.3   8.1   A Elbow B Elbow 92    A Elbow 8.5   8.0            Waveforms:             Clinical History: No specialty comments available.   He reports that he has never smoked. He has never used smokeless tobacco.  Recent Labs    02/02/22 1548  HGBA1C 5.8    Objective:  VS:  HT:     WT:   BMI:     BP:   HR: bpm  TEMP: ( )  RESP:  Physical Exam Vitals and nursing note reviewed.  Constitutional:      General: He is not in acute distress.    Appearance: Normal appearance. He is well-developed.  HENT:     Head: Normocephalic and atraumatic.  Eyes:     Conjunctiva/sclera: Conjunctivae normal.     Pupils: Pupils are equal, round, and reactive to light.  Cardiovascular:     Rate and Rhythm: Normal rate.     Pulses: Normal pulses.     Heart sounds: Normal heart sounds.  Pulmonary:     Effort: Pulmonary effort is normal. No respiratory distress.  Musculoskeletal:        General: No tenderness.     Cervical back: Normal range of motion and neck supple. No rigidity.     Right lower leg: No edema.     Left lower leg: No edema.     Comments: Inspection reveals no atrophy of the left  APB or FDI or hand intrinsics. There is no swelling, color changes, allodynia or dystrophic changes. There is 5 out of 5 strength in the left wrist extension, finger abduction and long finger flexion.  There is decreased sensation in the median nerve distribution on the left. There is a positive Phalen's test on the left.   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  Patient has braces in place.  He does have some shoulder abduction and elbow flexion.  Skin:    General: Skin is warm and dry.     Findings: No erythema or rash.  Neurological:     General: No focal deficit present.     Mental Status: He is alert and oriented to person, place, and time.     Sensory: No sensory deficit.     Motor: No weakness or abnormal muscle tone.     Coordination: Coordination normal.     Gait: Gait normal.  Psychiatric:        Mood and Affect: Mood normal.        Behavior: Behavior normal.        Thought Content: Thought content normal.     Ortho Exam  Imaging: No results found.  Past Medical/Family/Surgical/Social History: Medications & Allergies reviewed per EMR, new medications  updated. 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 No past surgical history on file. Social History   Occupational History   Not on file  Tobacco Use   Smoking status: Never   Smokeless tobacco: Never  Substance and Sexual Activity   Alcohol use: No   Drug use: No   Sexual activity: Not Currently

## 2022-12-21 NOTE — Procedures (Signed)
EMG & NCV Findings: Evaluation of the left median motor nerve showed prolonged distal onset latency (6.2 ms), reduced amplitude (2.9 mV), and decreased conduction velocity (Elbow-Wrist, 48 m/s).  The left median (across palm) sensory nerve showed no response (Palm) and prolonged distal peak latency (5.2 ms).  All remaining nerves (as indicated in the following tables) were within normal limits.    All examined muscles (as indicated in the following table) showed no evidence of electrical instability.    Impression: The above electrodiagnostic study is ABNORMAL and reveals evidence of a severe left median nerve entrapment at the wrist (carpal tunnel syndrome) affecting sensory and motor components.   There is no significant electrodiagnostic evidence of any other focal nerve entrapment, brachial plexopathy or cervical radiculopathy.   Recommendations: 1.  Follow-up with referring physician. 2.  Continue current management of symptoms. 3.  Suggest surgical evaluation.  ___________________________ [name] FAAPMR Board Certified, American Board of Physical Medicine and Rehabilitation    Nerve Conduction Studies Anti Sensory Summary Table   Stim Site NR Peak (ms) Norm Peak (ms) P-T Amp (V) Norm P-T Amp Site1 Site2 Delta-P (ms) Dist (cm) Vel (m/s) Norm Vel (m/s)  Left Median Acr Palm Anti Sensory (2nd Digit)  31C  Wrist    *5.2 <3.6 14.0 >10 Wrist Palm  0.0    Palm *NR  <2.0          Left Radial Anti Sensory (Base 1st Digit)  31.5C  Wrist    2.3 <3.1 11.7  Wrist Base 1st Digit 2.3 0.0    Left Ulnar Anti Sensory (5th Digit)  31.6C  Wrist    3.4 <3.7 30.1 >15.0 Wrist 5th Digit 3.4 14.0 41 >38   Motor Summary Table   Stim Site NR Onset (ms) Norm Onset (ms) O-P Amp (mV) Norm O-P Amp Site1 Site2 Delta-0 (ms) Dist (cm) Vel (m/s) Norm Vel (m/s)  Left Median Motor (Abd Poll Brev)  31.9C    Martin-Gruber  Wrist    *6.2 <4.2 *2.9 >5 Elbow Wrist 4.9 23.5 *48 >50  Elbow    11.1  2.8          Left Ulnar Motor (Abd Dig Min)  32.1C  Wrist    3.8 <4.2 8.5 >3 B Elbow Wrist 3.5 21.0 60 >53  B Elbow    7.3  8.1  A Elbow B Elbow 1.2 11.0 92 >53  A Elbow    8.5  8.0          EMG   Side Muscle Nerve Root Ins Act Fibs Psw Amp Dur Poly Recrt Int Dennie Bible Comment  Left 1stDorInt Ulnar C8-T1 Nml Nml Nml Nml Nml 0 Nml Nml   Left Abd Poll Brev Median C8-T1 Nml Nml Nml Nml Nml 0 Nml Nml   Left ExtDigCom   Nml Nml Nml Nml Nml 0 Nml Nml   Left Triceps Radial C6-7-8 Nml Nml Nml Nml Nml 0 Nml Nml   Left Deltoid Axillary C5-6 Nml Nml Nml Nml Nml 0 Nml Nml     Nerve Conduction Studies Anti Sensory Left/Right Comparison   Stim Site L Lat (ms) R Lat (ms) L-R Lat (ms) L Amp (V) R Amp (V) L-R Amp (%) Site1 Site2 L Vel (m/s) R Vel (m/s) L-R Vel (m/s)  Median Acr Palm Anti Sensory (2nd Digit)  31C  Wrist *5.2   14.0   Wrist [employer]  Radial Anti Sensory (Base 1st Digit)  31.5C  Wrist 2.3   11.7   Wrist Base 1st Digit     Ulnar Anti Sensory (5th Digit)  31.6C  Wrist 3.4   30.1   Wrist 5th Digit 41     Motor Left/Right Comparison   Stim Site L Lat (ms) R Lat (ms) L-R Lat (ms) L Amp (mV) R Amp (mV) L-R Amp (%) Site1 Site2 L Vel (m/s) R Vel (m/s) L-R Vel (m/s)  Median Motor (Abd Poll Brev)  31.9C    Martin-Gruber  Wrist *6.2   *2.9   Elbow Wrist *48    Elbow 11.1   2.8         Ulnar Motor (Abd Dig Min)  32.1C  Wrist 3.8   8.5   B Elbow Wrist 60    B Elbow 7.3   8.1   A Elbow B Elbow 92    A Elbow 8.5   8.0            Waveforms:

## 2023-02-15 ENCOUNTER — Other Ambulatory Visit: Payer: Self-pay | Admitting: Emergency Medicine

## 2023-02-15 DIAGNOSIS — N529 Male erectile dysfunction, unspecified: Secondary | ICD-10-CM

## 2024-03-20 ENCOUNTER — Other Ambulatory Visit: Payer: Self-pay | Admitting: Emergency Medicine

## 2024-03-20 DIAGNOSIS — N529 Male erectile dysfunction, unspecified: Secondary | ICD-10-CM

## 2024-04-09 ENCOUNTER — Encounter: Payer: Self-pay | Admitting: Emergency Medicine

## 2024-04-09 ENCOUNTER — Ambulatory Visit: Admitting: Emergency Medicine

## 2024-04-09 VITALS — BP 122/80 | HR 82 | Temp 97.9°F | Ht 70.0 in | Wt 200.0 lb

## 2024-04-09 DIAGNOSIS — Z130 Encounter for screening for diseases of the blood and blood-forming organs and certain disorders involving the immune mechanism: Secondary | ICD-10-CM

## 2024-04-09 DIAGNOSIS — Z1329 Encounter for screening for other suspected endocrine disorder: Secondary | ICD-10-CM | POA: Diagnosis not present

## 2024-04-09 DIAGNOSIS — Z1211 Encounter for screening for malignant neoplasm of colon: Secondary | ICD-10-CM

## 2024-04-09 DIAGNOSIS — Z0000 Encounter for general adult medical examination without abnormal findings: Secondary | ICD-10-CM

## 2024-04-09 DIAGNOSIS — Z13220 Encounter for screening for lipoid disorders: Secondary | ICD-10-CM | POA: Diagnosis not present

## 2024-04-09 DIAGNOSIS — Z13228 Encounter for screening for other metabolic disorders: Secondary | ICD-10-CM

## 2024-04-09 DIAGNOSIS — Z125 Encounter for screening for malignant neoplasm of prostate: Secondary | ICD-10-CM

## 2024-04-09 DIAGNOSIS — N529 Male erectile dysfunction, unspecified: Secondary | ICD-10-CM | POA: Diagnosis not present

## 2024-04-09 LAB — COMPREHENSIVE METABOLIC PANEL WITH GFR
ALT: 12 U/L (ref 3–53)
AST: 25 U/L (ref 5–37)
Albumin: 4.6 g/dL (ref 3.5–5.2)
Alkaline Phosphatase: 80 U/L (ref 39–117)
BUN: 12 mg/dL (ref 6–23)
CO2: 29 meq/L (ref 19–32)
Calcium: 8.8 mg/dL (ref 8.4–10.5)
Chloride: 102 meq/L (ref 96–112)
Creatinine, Ser: 1.11 mg/dL (ref 0.40–1.50)
GFR: 72.01 mL/min
Glucose, Bld: 84 mg/dL (ref 70–99)
Potassium: 3.8 meq/L (ref 3.5–5.1)
Sodium: 138 meq/L (ref 135–145)
Total Bilirubin: 0.4 mg/dL (ref 0.2–1.2)
Total Protein: 7.8 g/dL (ref 6.0–8.3)

## 2024-04-09 LAB — CBC WITH DIFFERENTIAL/PLATELET
Basophils Absolute: 0.1 K/uL (ref 0.0–0.1)
Basophils Relative: 0.9 % (ref 0.0–3.0)
Eosinophils Absolute: 0.3 K/uL (ref 0.0–0.7)
Eosinophils Relative: 5 % (ref 0.0–5.0)
HCT: 30.6 % — ABNORMAL LOW (ref 39.0–52.0)
Hemoglobin: 9.2 g/dL — ABNORMAL LOW (ref 13.0–17.0)
Lymphocytes Relative: 20.1 % (ref 12.0–46.0)
Lymphs Abs: 1.2 K/uL (ref 0.7–4.0)
MCHC: 30.1 g/dL (ref 30.0–36.0)
MCV: 62.6 fl — ABNORMAL LOW (ref 78.0–100.0)
Monocytes Absolute: 0.4 K/uL (ref 0.1–1.0)
Monocytes Relative: 6.3 % (ref 3.0–12.0)
Neutro Abs: 4.2 K/uL (ref 1.4–7.7)
Neutrophils Relative %: 67.7 % (ref 43.0–77.0)
Platelets: 314 K/uL (ref 150.0–400.0)
RBC: 4.89 Mil/uL (ref 4.22–5.81)
RDW: 19.2 % — ABNORMAL HIGH (ref 11.5–15.5)
WBC: 6.2 K/uL (ref 4.0–10.5)

## 2024-04-09 LAB — LIPID PANEL
Cholesterol: 160 mg/dL (ref 28–200)
HDL: 35.4 mg/dL — ABNORMAL LOW
LDL Cholesterol: 87 mg/dL (ref 10–99)
NonHDL: 124.68
Total CHOL/HDL Ratio: 5
Triglycerides: 186 mg/dL — ABNORMAL HIGH (ref 10.0–149.0)
VLDL: 37.2 mg/dL (ref 0.0–40.0)

## 2024-04-09 LAB — HEMOGLOBIN A1C: Hgb A1c MFr Bld: 5.8 % (ref 4.6–6.5)

## 2024-04-09 LAB — PSA: PSA: 0.65 ng/mL (ref 0.10–4.00)

## 2024-04-09 MED ORDER — SILDENAFIL CITRATE 100 MG PO TABS: 100.0000 mg | ORAL_TABLET | ORAL | 11 refills | Status: AC | PRN

## 2024-04-09 NOTE — Progress Notes (Signed)
 Mark Dalton 59 y.o.   Chief Complaint  Patient presents with   Follow-up    HISTORY OF PRESENT ILLNESS: This is a 59 y.o. male here for adult exam. Has no complaints or medical concerns today Needs screening for colon cancer   HPI   Prior to Admission medications  Medication Sig Start Date End Date Taking? Authorizing Provider  sildenafil  (VIAGRA ) 100 MG tablet TAKE 1/2 TO 1 TABLET(50 TO 100 MG) BY MOUTH DAILY AS NEEDED FOR ERECTILE DYSFUNCTION 02/15/23  Yes Kijana Cromie, Emil Schanz, MD    Allergies[1]  There are no active problems to display for this patient.   Past Medical History:  Diagnosis Date   Frequent headaches     History reviewed. No pertinent surgical history.  Social History   Socioeconomic History   Marital status: Single    Spouse name: Not on file   Number of children: Not on file   Years of education: Not on file   Highest education level: Not on file  Occupational History   Not on file  Tobacco Use   Smoking status: Never   Smokeless tobacco: Never  Substance and Sexual Activity   Alcohol use: No   Drug use: No   Sexual activity: Not Currently  Other Topics Concern   Not on file  Social History Narrative   Not on file   Social Drivers of Health   Tobacco Use: Low Risk (04/09/2024)   Patient History    Smoking Tobacco Use: Never    Smokeless Tobacco Use: Never    Passive Exposure: Not on file  Financial Resource Strain: Not on file  Food Insecurity: Not on file  Transportation Needs: Not on file  Physical Activity: Not on file  Stress: Not on file  Social Connections: Not on file  Intimate Partner Violence: Not on file  Depression (EYV7-0): Low Risk (04/09/2024)   Depression (PHQ2-9)    PHQ-2 Score: 0  Alcohol Screen: Not on file  Housing: Not on file  Utilities: Not on file  Health Literacy: Not on file    History reviewed. No pertinent family history.   Review of Systems  Constitutional: Negative.  Negative for chills  and fever.  HENT: Negative.  Negative for congestion and sore throat.   Respiratory: Negative.  Negative for cough and shortness of breath.   Cardiovascular: Negative.  Negative for chest pain and palpitations.  Gastrointestinal:  Negative for abdominal pain, diarrhea, nausea and vomiting.  Genitourinary: Negative.  Negative for dysuria and hematuria.  Skin: Negative.  Negative for rash.  Neurological: Negative.  Negative for dizziness and headaches.  All other systems reviewed and are negative.   Vitals:   04/09/24 1516  BP: 122/80  Pulse: 82  Temp: 97.9 F (36.6 C)  SpO2: 96%    Physical Exam Vitals reviewed.  Constitutional:      Appearance: Normal appearance.  HENT:     Head: Normocephalic.     Right Ear: Tympanic membrane, ear canal and external ear normal.     Left Ear: Tympanic membrane, ear canal and external ear normal.     Mouth/Throat:     Mouth: Mucous membranes are moist.     Pharynx: Oropharynx is clear.  Eyes:     Extraocular Movements: Extraocular movements intact.     Conjunctiva/sclera: Conjunctivae normal.     Pupils: Pupils are equal, round, and reactive to light.  Cardiovascular:     Rate and Rhythm: Normal rate and regular rhythm.  Pulses: Normal pulses.     Heart sounds: Normal heart sounds.  Pulmonary:     Effort: Pulmonary effort is normal.     Breath sounds: Normal breath sounds.  Abdominal:     Palpations: Abdomen is soft.     Tenderness: There is no abdominal tenderness.  Musculoskeletal:     Cervical back: No tenderness.  Lymphadenopathy:     Cervical: No cervical adenopathy.  Skin:    General: Skin is warm and dry.     Capillary Refill: Capillary refill takes less than 2 seconds.  Neurological:     General: No focal deficit present.     Mental Status: He is alert and oriented to person, place, and time.  Psychiatric:        Mood and Affect: Mood normal.        Behavior: Behavior normal.      ASSESSMENT & PLAN: Problem  List Items Addressed This Visit   None Visit Diagnoses       Routine general medical examination at a health care facility    -  Primary   Relevant Orders   CBC with Differential/Platelet   Comprehensive metabolic panel with GFR   PSA   Hemoglobin A1c   Lipid panel     Erectile dysfunction, unspecified erectile dysfunction type       Relevant Medications   sildenafil  (VIAGRA ) 100 MG tablet     Colon cancer screening       Relevant Orders   Ambulatory referral to Gastroenterology     Screening for deficiency anemia       Relevant Orders   CBC with Differential/Platelet     Screening for lipoid disorders       Relevant Orders   Lipid panel     Screening for endocrine, metabolic and immunity disorder       Relevant Orders   Comprehensive metabolic panel with GFR   Hemoglobin A1c     Screening for prostate cancer       Relevant Orders   PSA      Modifiable risk factors discussed with patient. Anticipatory guidance according to age provided. The following topics were also discussed: Social Determinants of Health Smoking.  Non-smoker Diet and nutrition Benefits of exercise Cancer screening and need for colon cancer screening with colonoscopy Vaccinations review and recommendations Cardiovascular risk assessment and need for blood work The 10-year ASCVD risk score (Arnett DK, et al., 2019) is: 7.5%   Values used to calculate the score:     Age: 59 years     Clinically relevant sex: Male     Is Non-Hispanic African American: Yes     Diabetic: No     Tobacco smoker: No     Systolic Blood Pressure: 122 mmHg     Is BP treated: No     HDL Cholesterol: 35.5 mg/dL     Total Cholesterol: 136 mg/dL  Mental health including depression and anxiety Fall and accident prevention  Patient Instructions  Health Maintenance, Male Adopting a healthy lifestyle and getting preventive care are important in promoting health and wellness. Ask your health care provider about: The right  schedule for you to have regular tests and exams. Things you can do on your own to prevent diseases and keep yourself healthy. What should I know about diet, weight, and exercise? Eat a healthy diet  Eat a diet that includes plenty of vegetables, fruits, low-fat dairy products, and lean protein. Do not eat a lot of foods  that are high in solid fats, added sugars, or sodium. Maintain a healthy weight Body mass index (BMI) is a measurement that can be used to identify possible weight problems. It estimates body fat based on height and weight. Your health care provider can help determine your BMI and help you achieve or maintain a healthy weight. Get regular exercise Get regular exercise. This is one of the most important things you can do for your health. Most adults should: Exercise for at least 150 minutes each week. The exercise should increase your heart rate and make you sweat (moderate-intensity exercise). Do strengthening exercises at least twice a week. This is in addition to the moderate-intensity exercise. Spend less time sitting. Even light physical activity can be beneficial. Watch cholesterol and blood lipids Have your blood tested for lipids and cholesterol at 61 years of age, then have this test every 5 years. You may need to have your cholesterol levels checked more often if: Your lipid or cholesterol levels are high. You are older than 61 years of age. You are at high risk for heart disease. What should I know about cancer screening? Many types of cancers can be detected early and may often be prevented. Depending on your health history and family history, you may need to have cancer screening at various ages. This may include screening for: Colorectal cancer. Prostate cancer. Skin cancer. Lung cancer. What should I know about heart disease, diabetes, and high blood pressure? Blood pressure and heart disease High blood pressure causes heart disease and increases the risk of  stroke. This is more likely to develop in people who have high blood pressure readings or are overweight. Talk with your health care provider about your target blood pressure readings. Have your blood pressure checked: Every 3-5 years if you are 36-25 years of age. Every year if you are 34 years old or older. If you are between the ages of 62 and 46 and are a current or former smoker, ask your health care provider if you should have a one-time screening for abdominal aortic aneurysm (AAA). Diabetes Have regular diabetes screenings. This checks your fasting blood sugar level. Have the screening done: Once every three years after age 41 if you are at a normal weight and have a low risk for diabetes. More often and at a younger age if you are overweight or have a high risk for diabetes. What should I know about preventing infection? Hepatitis B If you have a higher risk for hepatitis B, you should be screened for this virus. Talk with your health care provider to find out if you are at risk for hepatitis B infection. Hepatitis C Blood testing is recommended for: Everyone born from [birth year] through 1967. Anyone with known risk factors for hepatitis C. Sexually transmitted infections (STIs) You should be screened each year for STIs, including gonorrhea and chlamydia, if: You are sexually active and are younger than 62 years of age. You are older than 61 years of age and your health care provider tells you that you are at risk for this type of infection. Your sexual activity has changed since you were last screened, and you are at increased risk for chlamydia or gonorrhea. Ask your health care provider if you are at risk. Ask your health care provider about whether you are at high risk for HIV. Your health care provider may recommend a prescription medicine to help prevent HIV infection. If you choose to take medicine to prevent HIV, you should first  get tested for HIV. You should then be tested every 3  months for as long as you are taking the medicine. Follow these instructions at home: Alcohol use Do not drink alcohol if your health care provider tells you not to drink. If you drink alcohol: Limit how much you have to 0-2 drinks a day. Know how much alcohol is in your drink. In the [place] one drink equals one 12 oz bottle of beer (355 mL), one 5 oz glass of wine (148 mL), or one 1 oz glass of hard liquor (44 mL). Lifestyle Do not use any products that contain nicotine or tobacco. These products include cigarettes, chewing tobacco, and vaping devices, such as e-cigarettes. If you need help quitting, ask your health care provider. Do not use street drugs. Do not share needles. Ask your health care provider for help if you need support or information about quitting drugs. General instructions Schedule regular health, dental, and eye exams. Stay current with your vaccines. Tell your health care provider if: You often feel depressed. You have ever been abused or do not feel safe at home. Summary Adopting a healthy lifestyle and getting preventive care are important in promoting health and wellness. Follow your health care provider's instructions about healthy diet, exercising, and getting tested or screened for diseases. Follow your health care provider's instructions on monitoring your cholesterol and blood pressure. This information is not intended to replace advice given to you by your health care provider. Make sure you discuss any questions you have with your health care provider. Document Revised: 08/11/2020 Document Reviewed: 08/11/2020 Elsevier Patient Education  2024 [employer]      Emil Schaumann, MD [facility]    [1] No Known Allergies

## 2024-04-09 NOTE — Patient Instructions (Signed)
 Health Maintenance, Male  Adopting a healthy lifestyle and getting preventive care are important in promoting health and wellness. Ask your health care provider about:  The right schedule for you to have regular tests and exams.  Things you can do on your own to prevent diseases and keep yourself healthy.  What should I know about diet, weight, and exercise?  Eat a healthy diet    Eat a diet that includes plenty of vegetables, fruits, low-fat dairy products, and lean protein.  Do not eat a lot of foods that are high in solid fats, added sugars, or sodium.  Maintain a healthy weight  Body mass index (BMI) is a measurement that can be used to identify possible weight problems. It estimates body fat based on height and weight. Your health care provider can help determine your BMI and help you achieve or maintain a healthy weight.  Get regular exercise  Get regular exercise. This is one of the most important things you can do for your health. Most adults should:  Exercise for at least 150 minutes each week. The exercise should increase your heart rate and make you sweat (moderate-intensity exercise).  Do strengthening exercises at least twice a week. This is in addition to the moderate-intensity exercise.  Spend less time sitting. Even light physical activity can be beneficial.  Watch cholesterol and blood lipids  Have your blood tested for lipids and cholesterol at 61 years of age, then have this test every 5 years.  You may need to have your cholesterol levels checked more often if:  Your lipid or cholesterol levels are high.  You are older than 61 years of age.  You are at high risk for heart disease.  What should I know about cancer screening?  Many types of cancers can be detected early and may often be prevented. Depending on your health history and family history, you may need to have cancer screening at various ages. This may include screening for:  Colorectal cancer.  Prostate cancer.  Skin cancer.  Lung  cancer.  What should I know about heart disease, diabetes, and high blood pressure?  Blood pressure and heart disease  High blood pressure causes heart disease and increases the risk of stroke. This is more likely to develop in people who have high blood pressure readings or are overweight.  Talk with your health care provider about your target blood pressure readings.  Have your blood pressure checked:  Every 3-5 years if you are 24-52 years of age.  Every year if you are 3 years old or older.  If you are between the ages of 60 and 72 and are a current or former smoker, ask your health care provider if you should have a one-time screening for abdominal aortic aneurysm (AAA).  Diabetes  Have regular diabetes screenings. This checks your fasting blood sugar level. Have the screening done:  Once every three years after age 66 if you are at a normal weight and have a low risk for diabetes.  More often and at a younger age if you are overweight or have a high risk for diabetes.  What should I know about preventing infection?  Hepatitis B  If you have a higher risk for hepatitis B, you should be screened for this virus. Talk with your health care provider to find out if you are at risk for hepatitis B infection.  Hepatitis C  Blood testing is recommended for:  Everyone born from [birth year] through 1967.  Anyone  with known risk factors for hepatitis C.  Sexually transmitted infections (STIs)  You should be screened each year for STIs, including gonorrhea and chlamydia, if:  You are sexually active and are younger than 61 years of age.  You are older than 61 years of age and your health care provider tells you that you are at risk for this type of infection.  Your sexual activity has changed since you were last screened, and you are at increased risk for chlamydia or gonorrhea. Ask your health care provider if you are at risk.  Ask your health care provider about whether you are at high risk for HIV. Your health care provider  may recommend a prescription medicine to help prevent HIV infection. If you choose to take medicine to prevent HIV, you should first get tested for HIV. You should then be tested every 3 months for as long as you are taking the medicine.  Follow these instructions at home:  Alcohol use  Do not drink alcohol if your health care provider tells you not to drink.  If you drink alcohol:  Limit how much you have to 0-2 drinks a day.  Know how much alcohol is in your drink. In the [place] one drink equals one 12 oz bottle of beer (355 mL), one 5 oz glass of wine (148 mL), or one 1 oz glass of hard liquor (44 mL).  Lifestyle  Do not use any products that contain nicotine or tobacco. These products include cigarettes, chewing tobacco, and vaping devices, such as e-cigarettes. If you need help quitting, ask your health care provider.  Do not use street drugs.  Do not share needles.  Ask your health care provider for help if you need support or information about quitting drugs.  General instructions  Schedule regular health, dental, and eye exams.  Stay current with your vaccines.  Tell your health care provider if:  You often feel depressed.  You have ever been abused or do not feel safe at home.  Summary  Adopting a healthy lifestyle and getting preventive care are important in promoting health and wellness.  Follow your health care provider's instructions about healthy diet, exercising, and getting tested or screened for diseases.  Follow your health care provider's instructions on monitoring your cholesterol and blood pressure.  This information is not intended to replace advice given to you by your health care provider. Make sure you discuss any questions you have with your health care provider.  Document Revised: 08/11/2020 Document Reviewed: 08/11/2020  [employer]vier Patient Education  2024 [employer].

## 2024-04-10 ENCOUNTER — Ambulatory Visit: Payer: Self-pay | Admitting: Emergency Medicine
